# Patient Record
Sex: Female | Born: 1995 | Race: Black or African American | Hispanic: No | Marital: Single | Smoking: Never smoker
Health system: Southern US, Community
[De-identification: ages and names within clinical notes are randomized; demographics above are authoritative.]

## PROBLEM LIST (undated history)

## (undated) ENCOUNTER — Inpatient Hospital Stay (HOSPITAL_COMMUNITY): Payer: Self-pay

## (undated) DIAGNOSIS — B009 Herpesviral infection, unspecified: Secondary | ICD-10-CM

## (undated) DIAGNOSIS — D573 Sickle-cell trait: Secondary | ICD-10-CM

## (undated) DIAGNOSIS — G43909 Migraine, unspecified, not intractable, without status migrainosus: Secondary | ICD-10-CM

## (undated) DIAGNOSIS — J45909 Unspecified asthma, uncomplicated: Secondary | ICD-10-CM

## (undated) DIAGNOSIS — F410 Panic disorder [episodic paroxysmal anxiety] without agoraphobia: Secondary | ICD-10-CM

## (undated) HISTORY — DX: Herpesviral infection, unspecified: B00.9

## (undated) HISTORY — PX: TONSILLECTOMY: SUR1361

## (undated) HISTORY — PX: WISDOM TOOTH EXTRACTION: SHX21

---
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
## 2013-10-10 ENCOUNTER — Encounter: Payer: Self-pay | Admitting: *Deleted

## 2013-10-16 ENCOUNTER — Ambulatory Visit: Payer: Medicaid Other | Admitting: Neurology

## 2013-10-24 ENCOUNTER — Encounter: Payer: Self-pay | Admitting: *Deleted

## 2013-10-30 ENCOUNTER — Ambulatory Visit (INDEPENDENT_AMBULATORY_CARE_PROVIDER_SITE_OTHER): Payer: Medicaid Other | Admitting: Neurology

## 2013-10-30 ENCOUNTER — Encounter: Payer: Self-pay | Admitting: Neurology

## 2013-10-30 VITALS — BP 120/82 | HR 84 | Ht 61.0 in | Wt 113.0 lb

## 2013-10-30 DIAGNOSIS — G43709 Chronic migraine without aura, not intractable, without status migrainosus: Secondary | ICD-10-CM

## 2013-10-30 DIAGNOSIS — G4440 Drug-induced headache, not elsewhere classified, not intractable: Secondary | ICD-10-CM

## 2013-10-30 MED ORDER — TOPIRAMATE 50 MG PO TABS: ORAL_TABLET | ORAL | Status: DC
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
## 2013-10-30 NOTE — Patient Instructions (Signed)
Migraine Recommendations: 1.  When you get a headache, try taking Excedrin or naproxen (Aleve) at immediate onset of migraine. 2.  Some of the problem is that you are taking too much ibuprofen.  Limit use of pain relievers to no more than 2 days out of the week.  This will help reduce risk of rebound headaches. 3.  Keep a headache diary. 4.  Stay adequately hydrated. 5.  Maintain good sleep hygiene. 6.  Maintain proper stress management. 7.  Improve diet and start routine exercise.  Do not skip meals and cut out junk food 8.  Eliminate caffeine 9.  We will start topiramate (topamax) 50mg  tablets.  Take 1/2 tablet daily at bedtime for 7 days, then increase to 1 full tablet at bedtime.  Possible side effects include: impaired thinking, sedation, paresthesias (numbness and tingling) and weight loss.  It may cause dehydration and there is a small risk for kidney stones, so make sure to stay hydrated with water during the day.  There is also a very small risk for glaucoma, so if you notice any change in your vision while taking this medication, see an ophthalmologist.  There is also a very small risk of possible suicidal ideation, as it the case with all antiepileptic medications.  10.  Call in 4 weeks with update and we can adjust dose of topamax if needed. 11.  Follow up in 3 months.

## 2014-01-30 ENCOUNTER — Ambulatory Visit (INDEPENDENT_AMBULATORY_CARE_PROVIDER_SITE_OTHER): Payer: Medicaid Other | Admitting: Neurology

## 2014-01-30 ENCOUNTER — Encounter: Payer: Self-pay | Admitting: Neurology

## 2014-01-30 VITALS — BP 118/66 | HR 72 | Temp 97.5°F | Resp 16 | Wt 114.9 lb

## 2014-01-30 DIAGNOSIS — G4440 Drug-induced headache, not elsewhere classified, not intractable: Secondary | ICD-10-CM

## 2014-01-30 DIAGNOSIS — G43709 Chronic migraine without aura, not intractable, without status migrainosus: Secondary | ICD-10-CM | POA: Insufficient documentation

## 2014-01-30 MED ORDER — TOPIRAMATE 25 MG PO TABS: ORAL_TABLET | ORAL | Status: DC
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
## 2014-01-30 NOTE — Patient Instructions (Signed)
1.  You should be taking the topamax every day.  Start topamax  at bedtime for 7 days, then increase to 2 tablets ( ) at bedtime.  Call in 3-4 weeks with update and we can increase dose if needed. 2.  When you first get the headache, take naproxen  at earliest onset.  You may repeat dose in 12 hours if needed.  Do not take any pain relievers more than 2 days out of the week. 3.  Exercise regularly 4.  Keep a headache diary, recording which days you have a headache, what medication you took and how long the headache lasted. Then you can look back at the end of the month to see if we are making progress. 5.  Follow up in 3 months.

## 2014-05-02 ENCOUNTER — Ambulatory Visit (INDEPENDENT_AMBULATORY_CARE_PROVIDER_SITE_OTHER): Payer: Medicaid Other | Admitting: Neurology

## 2014-05-02 ENCOUNTER — Encounter: Payer: Self-pay | Admitting: Neurology

## 2014-05-02 VITALS — BP 104/70 | HR 68 | Temp 98.5°F | Resp 16 | Ht 61.0 in | Wt 117.3 lb

## 2014-05-02 DIAGNOSIS — G43009 Migraine without aura, not intractable, without status migrainosus: Secondary | ICD-10-CM

## 2014-05-02 MED ORDER — TOPIRAMATE 50 MG PO TABS: 50.0000 mg | ORAL_TABLET | Freq: Every day | ORAL | Status: DC
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
## 2014-05-02 NOTE — Patient Instructions (Signed)
I refilled the topamax with 50mg  tablets.  Take 1 tablet at bedtime daily. Take folic acid 1mg  daily Follow up in 3 months

## 2014-05-07 ENCOUNTER — Ambulatory Visit: Payer: Medicaid Other

## 2014-06-09 ENCOUNTER — Ambulatory Visit: Payer: Medicaid Other | Attending: Internal Medicine

## 2014-06-23 ENCOUNTER — Emergency Department (HOSPITAL_COMMUNITY): Payer: Medicaid Other

## 2014-06-23 ENCOUNTER — Emergency Department (HOSPITAL_COMMUNITY)
Admission: EM | Admit: 2014-06-23 | Discharge: 2014-06-23 | Disposition: A | Discharge status: Home / Self Care | Payer: Medicaid Other | Attending: Emergency Medicine | Admitting: Emergency Medicine

## 2014-06-23 ENCOUNTER — Encounter (HOSPITAL_COMMUNITY): Payer: Self-pay | Admitting: Emergency Medicine

## 2014-06-23 DIAGNOSIS — J069 Acute upper respiratory infection, unspecified: Secondary | ICD-10-CM | POA: Insufficient documentation

## 2014-06-23 DIAGNOSIS — Z79899 Other long term (current) drug therapy: Secondary | ICD-10-CM | POA: Insufficient documentation

## 2014-06-23 DIAGNOSIS — R05 Cough: Secondary | ICD-10-CM

## 2014-06-23 DIAGNOSIS — Z3202 Encounter for pregnancy test, result negative: Secondary | ICD-10-CM | POA: Insufficient documentation

## 2014-06-23 DIAGNOSIS — G43009 Migraine without aura, not intractable, without status migrainosus: Secondary | ICD-10-CM

## 2014-06-23 DIAGNOSIS — R059 Cough, unspecified: Secondary | ICD-10-CM

## 2014-06-23 DIAGNOSIS — J45909 Unspecified asthma, uncomplicated: Secondary | ICD-10-CM | POA: Insufficient documentation

## 2014-06-23 LAB — BASIC METABOLIC PANEL
ANION GAP: 7 (ref 5–15)
BUN: 12 mg/dL (ref 6–23)
CALCIUM: 9.1 mg/dL (ref 8.4–10.5)
CO2: 22 mmol/L (ref 19–32)
Chloride: 106 mmol/L (ref 96–112)
Creatinine, Ser: 0.73 mg/dL (ref 0.50–1.10)
Glucose, Bld: 89 mg/dL (ref 70–99)
Potassium: 4 mmol/L (ref 3.5–5.1)
SODIUM: 135 mmol/L (ref 135–145)

## 2014-06-23 LAB — CBC WITH DIFFERENTIAL/PLATELET
BASOS ABS: 0 10*3/uL (ref 0.0–0.1)
BASOS PCT: 0 % (ref 0–1)
EOS ABS: 0.2 10*3/uL (ref 0.0–0.7)
Eosinophils Relative: 4 % (ref 0–5)
HCT: 40.4 % (ref 36.0–46.0)
HEMOGLOBIN: 14.5 g/dL (ref 12.0–15.0)
Lymphocytes Relative: 35 % (ref 12–46)
Lymphs Abs: 2.2 10*3/uL (ref 0.7–4.0)
MCH: 28.2 pg (ref 26.0–34.0)
MCHC: 35.9 g/dL (ref 30.0–36.0)
MCV: 78.6 fL (ref 78.0–100.0)
MONO ABS: 0.5 10*3/uL (ref 0.1–1.0)
MONOS PCT: 9 % (ref 3–12)
NEUTROS ABS: 3.3 10*3/uL (ref 1.7–7.7)
NEUTROS PCT: 52 % (ref 43–77)
PLATELETS: 196 10*3/uL (ref 150–400)
RBC: 5.14 MIL/uL — AB (ref 3.87–5.11)
RDW: 12.6 % (ref 11.5–15.5)
WBC: 6.2 10*3/uL (ref 4.0–10.5)

## 2014-06-23 LAB — POC URINE PREG, ED: Preg Test, Ur: NEGATIVE

## 2014-06-23 MED ORDER — METOCLOPRAMIDE HCL 5 MG/ML IJ SOLN
10.0000 mg | Freq: Once | INTRAMUSCULAR | Status: AC
  Administered 2014-06-23: 10 mg via INTRAVENOUS
  Filled 2014-06-23: qty 2

## 2014-06-23 MED ORDER — DIPHENHYDRAMINE HCL 50 MG/ML IJ SOLN
25.0000 mg | Freq: Once | INTRAMUSCULAR | Status: AC
  Administered 2014-06-23: 25 mg via INTRAVENOUS
  Filled 2014-06-23: qty 1

## 2014-06-23 MED ORDER — SODIUM CHLORIDE 0.9 % IV BOLUS (SEPSIS)
1000.0000 mL | Freq: Once | INTRAVENOUS | Status: AC
  Administered 2014-06-23: 1000 mL via INTRAVENOUS

## 2014-06-23 MED ORDER — DEXAMETHASONE SODIUM PHOSPHATE 10 MG/ML IJ SOLN
10.0000 mg | Freq: Once | INTRAMUSCULAR | Status: AC
  Administered 2014-06-23: 10 mg via INTRAVENOUS
  Filled 2014-06-23: qty 1

## 2014-06-23 NOTE — Discharge Instructions (Signed)
Chest Pain (Nonspecific) °It is often hard to give a specific diagnosis for the cause of chest pain. There is always a chance that your pain could be related to something serious, such as a heart attack or a blood clot in the lungs. You need to follow up with your health care provider for further evaluation. °CAUSES  °· Heartburn. °· Pneumonia or bronchitis. °· Anxiety or stress. °· Inflammation around your heart (pericarditis) or lung (pleuritis or pleurisy). °· A blood clot in the lung. °· A collapsed lung (pneumothorax). It can develop suddenly on its own (spontaneous pneumothorax) or from trauma to the chest. °· Shingles infection (herpes zoster virus). °The chest wall is composed of bones, muscles, and cartilage. Any of these can be the source of the pain. °· The bones can be bruised by injury. °· The muscles or cartilage can be strained by coughing or overwork. °· The cartilage can be affected by inflammation and become sore (costochondritis). °DIAGNOSIS  °Lab tests or other studies may be needed to find the cause of your pain. Your health care provider may have you take a test called an ambulatory electrocardiogram (ECG). An ECG records your heartbeat patterns over a 24-hour period. You may also have other tests, such as: °· Transthoracic echocardiogram (TTE). During echocardiography, sound waves are used to evaluate how blood flows through your heart. °· Transesophageal echocardiogram (TEE). °· Cardiac monitoring. This allows your health care provider to monitor your heart rate and rhythm in real time. °· Holter monitor. This is a portable device that records your heartbeat and can help diagnose heart arrhythmias. It allows your health care provider to track your heart activity for several days, if needed. °· Stress tests by exercise or by giving medicine that makes the heart beat faster. °TREATMENT  °· Treatment depends on what may be causing your chest pain. Treatment may include: °· Acid blockers for  heartburn. °· Anti-inflammatory medicine. °· Pain medicine for inflammatory conditions. °· Antibiotics if an infection is present. °· You may be advised to change lifestyle habits. This includes stopping smoking and avoiding alcohol, caffeine, and chocolate. °· You may be advised to keep your head raised (elevated) when sleeping. This reduces the chance of acid going backward from your stomach into your esophagus. °Most of the time, nonspecific chest pain will improve within 2-3 days with rest and mild pain medicine.  °HOME CARE INSTRUCTIONS  °· If antibiotics were prescribed, take them as directed. Finish them even if you start to feel better. °· For the next few days, avoid physical activities that bring on chest pain. Continue physical activities as directed. °· Do not use any tobacco products, including cigarettes, chewing tobacco, or electronic cigarettes. °· Avoid drinking alcohol. °· Only take medicine as directed by your health care provider. °· Follow your health care provider's suggestions for further testing if your chest pain does not go away. °· Keep any follow-up appointments you made. If you do not go to an appointment, you could develop lasting (chronic) problems with pain. If there is any problem keeping an appointment, call to reschedule. °SEEK MEDICAL CARE IF:  °· Your chest pain does not go away, even after treatment. °· You have a rash with blisters on your chest. °· You have a fever. °SEEK IMMEDIATE MEDICAL CARE IF:  °· You have increased chest pain or pain that spreads to your arm, neck, jaw, back, or abdomen. °· You have shortness of breath. °· You have an increasing cough, or you cough   up blood. °· You have severe back or abdominal pain. °· You feel nauseous or vomit. °· You have severe weakness. °· You faint. °· You have chills. °This is an emergency. Do not wait to see if the pain will go away. Get medical help at once. Call your local emergency services (911 in [place] Do not drive  yourself to the hospital. °MAKE SURE YOU:  °· Understand these instructions. °· Will watch your condition. °· Will get help right away if you are not doing well or get worse. °Document Released: 02/09/2005 Document Revised: 05/07/2013 Document Reviewed: 12/06/2007 °[employer]® Patient Information ©2015 [employer]. This information is not intended to replace advice given to you by your health care provider. Make sure you discuss any questions you have with your health care provider. ° °Migraine Headache °A migraine headache is an intense, throbbing pain on one or both sides of your head. A migraine can last for 30 minutes to several hours. °CAUSES  °The exact cause of a migraine headache is not always known. However, a migraine may be caused when nerves in the brain become irritated and release chemicals that cause inflammation. This causes pain. °Certain things may also trigger migraines, such as: °· Alcohol. °· Smoking. °· Stress. °· Menstruation. °· Aged cheeses. °· Foods or drinks that contain nitrates, glutamate, aspartame, or tyramine. °· Lack of sleep. °· Chocolate. °· Caffeine. °· Hunger. °· Physical exertion. °· Fatigue. °· Medicines used to treat chest pain (nitroglycerine), birth control pills, estrogen, and some blood pressure medicines. °SIGNS AND SYMPTOMS °· Pain on one or both sides of your head. °· Pulsating or throbbing pain. °· Severe pain that prevents daily activities. °· Pain that is aggravated by any physical activity. °· Nausea, vomiting, or both. °· Dizziness. °· Pain with exposure to bright lights, loud noises, or activity. °· General sensitivity to bright lights, loud noises, or smells. °Before you get a migraine, you may get warning signs that a migraine is coming (aura). An aura may include: °· Seeing flashing lights. °· Seeing bright spots, halos, or zigzag lines. °· Having tunnel vision or blurred vision. °· Having feelings of numbness or tingling. °· Having trouble talking. °· Having  muscle weakness. °DIAGNOSIS  °A migraine headache is often diagnosed based on: °· Symptoms. °· Physical exam. °· A CT scan or MRI of your head. These imaging tests cannot diagnose migraines, but they can help rule out other causes of headaches. °TREATMENT °Medicines may be given for pain and nausea. Medicines can also be given to help prevent recurrent migraines.  °HOME CARE INSTRUCTIONS °· Only take over-the-counter or prescription medicines for pain or discomfort as directed by your health care provider. The use of long-term narcotics is not recommended. °· Lie down in a dark, quiet room when you have a migraine. °· Keep a journal to find out what may trigger your migraine headaches. For example, write down: °¨ What you eat and drink. °¨ How much sleep you get. °¨ Any change to your diet or medicines. °· Limit alcohol consumption. °· Quit smoking if you smoke. °· Get 7-9 hours of sleep, or as recommended by your health care provider. °· Limit stress. °· Keep lights dim if bright lights bother you and make your migraines worse. °SEEK IMMEDIATE MEDICAL CARE IF:  °· Your migraine becomes severe. °· You have a fever. °· You have a stiff neck. °· You have vision loss. °· You have muscular weakness or loss of muscle control. °· You start losing   your balance or have trouble walking. °· You feel faint or pass out. °· You have severe symptoms that are different from your first symptoms. °MAKE SURE YOU:  °· Understand these instructions. °· Will watch your condition. °· Will get help right away if you are not doing well or get worse. °Document Released: 05/02/2005 Document Revised: 09/16/2013 Document Reviewed: 01/07/2013 °[employer]® Patient Information ©2015 [employer]. This information is not intended to replace advice given to you by your health care provider. Make sure you discuss any questions you have with your health care provider. ° °

## 2014-06-23 NOTE — ED Notes (Signed)
Pt c/o cough and pain with cough and HA x 2 days

## 2014-06-23 NOTE — ED Notes (Signed)
Attempted IV start X1, unable to thread catheter through, will have second RN to attempt.

## 2014-06-23 NOTE — ED Provider Notes (Signed)
CSN: [id number]     Arrival date & time 06/23/14  1227 History   First MD Initiated Contact with Patient 06/23/14 1622     Chief Complaint  Patient presents with  . Cough  . Headache     (Consider location/radiation/quality/duration/timing/severity/associated sxs/prior Treatment) Patient is a 19 y.o. female presenting with cough and headaches.  Cough Cough characteristics:  Non-productive Severity:  Moderate Onset quality:  Gradual Duration:  3 days Timing:  Constant Progression:  Unchanged Chronicity:  New Smoker: no   Context: not upper respiratory infection   Relieved by:  Nothing Worsened by:  Nothing tried Associated symptoms: chest pain (pressure, worse with coughing and breathing) and headaches   Associated symptoms: no diaphoresis, no rhinorrhea, no shortness of breath and no sinus congestion   Headache Associated symptoms: cough     Past Medical History  Diagnosis Date  . Migraines   . Asthma    History reviewed. No pertinent past surgical history. Family History  Problem Relation Age of Onset  . Bone cancer Mother    History  Substance Use Topics  . Smoking status: Never Smoker   . Smokeless tobacco: Not on file  . Alcohol Use: 0.0 oz/week    0 Not specified per week     Comment: social   OB History    No data available     Review of Systems  Constitutional: Negative for diaphoresis.  HENT: Negative for rhinorrhea.   Respiratory: Positive for cough. Negative for shortness of breath.   Cardiovascular: Positive for chest pain (pressure, worse with coughing and breathing).  Neurological: Positive for headaches.  All other systems reviewed and are negative.     Allergies  Review of patient's allergies indicates no known allergies.  Home Medications   Prior to Admission medications   Medication Sig Start Date End Date Taking? Authorizing Provider  albuterol (PROVENTIL HFA;VENTOLIN HFA) 108 (90 BASE) MCG/ACT inhaler Inhale 2 puffs into the  lungs every 6 (six) hours as needed. For shortness of breath    Historical Provider, MD  ibuprofen (ADVIL,MOTRIN) 200 MG tablet Take 200 mg by mouth every 6 (six) hours as needed.    Historical Provider, MD  topiramate (TOPAMAX) 50 MG tablet Take 1 tablet (50 mg total) by mouth at bedtime. 05/02/14   Adam Gus Rankinobert Jaffe, DO   BP 122/60 mmHg  Pulse 78  Temp(Src) 97.9 F (36.6 C) (Oral)  Resp 18  SpO2 100% Physical Exam  Constitutional: She is oriented to person, place, and time. She appears well-developed and well-nourished.  HENT:  Head: Normocephalic and atraumatic.  Right Ear: External ear normal.  Left Ear: External ear normal.  Eyes: Conjunctivae and EOM are normal. Pupils are equal, round, and reactive to light.  Neck: Normal range of motion. Neck supple.  Cardiovascular: Normal rate, regular rhythm, normal heart sounds and intact distal pulses.   Pulmonary/Chest: Effort normal and breath sounds normal.  Abdominal: Soft. Bowel sounds are normal. There is no tenderness.  Musculoskeletal: Normal range of motion.  Neurological: She is alert and oriented to person, place, and time.  Skin: Skin is warm and dry.  Vitals reviewed.   ED Course  Procedures (including critical care time) Labs Review Labs Reviewed  CBC WITH DIFFERENTIAL/PLATELET - Abnormal; Notable for the following:    RBC 5.14 (*)    All other components within normal limits  BASIC METABOLIC PANEL  POC URINE PREG, ED    Imaging Review Dg Chest 2 View (if Patient Has Fever  And/or Copd)  06/23/2014   CLINICAL DATA:  Chest pain since 06/20/2014.  No known injury.  EXAM: CHEST  2 VIEW  COMPARISON:  PA and lateral chest 01/27/2012.  FINDINGS: Heart size and mediastinal contours are within normal limits. Both lungs are clear. Visualized skeletal structures are unremarkable.  IMPRESSION: Negative exam.   Electronically Signed   By: Drusilla Kanner M.D.   On: 06/23/2014 13:35     EKG Interpretation   Date/Time:   Monday June 23 2014 12:38:24 EST Ventricular Rate:  81 PR Interval:  152 QRS Duration: 80 QT Interval:  376 QTC Calculation: 436 R Axis:   88 Text Interpretation:  Normal sinus rhythm with sinus arrhythmia Normal ECG  No significant change since last tracing Confirmed by Mirian Mo  [phone]) on 06/23/2014 5:57:08 PM      MDM   Final diagnoses:  Migraine without aura and without status migrainosus, not intractable  Cough  Upper respiratory infection    19 y.o. female with pertinent PMH of migraines, asthma presents with headache and chest pain, first symptoms 1 week ago.  On arrival she is well appearing, stable vitals.  Benign exam.  No dysuria.  Wu as above with clear CXR.  ECG unremarkable.  Ha relieved with reglan, benadryl, decadron.  Pt requests to leave.  DC home in stable condition  I have reviewed all laboratory and imaging studies if ordered as above  1. Migraine without aura and without status migrainosus, not intractable   2. Cough   3. Upper respiratory infection         Mirian Mo, MD 06/23/14 [phone]

## 2014-08-01 ENCOUNTER — Ambulatory Visit: Payer: Medicaid Other | Admitting: Neurology

## 2014-11-21 ENCOUNTER — Encounter (HOSPITAL_COMMUNITY): Payer: Self-pay

## 2014-11-21 ENCOUNTER — Emergency Department (HOSPITAL_COMMUNITY)
Admission: EM | Admit: 2014-11-21 | Discharge: 2014-11-21 | Disposition: A | Discharge status: Home / Self Care | Payer: Self-pay | Attending: Emergency Medicine | Admitting: Emergency Medicine

## 2014-11-21 DIAGNOSIS — Z3202 Encounter for pregnancy test, result negative: Secondary | ICD-10-CM | POA: Insufficient documentation

## 2014-11-21 DIAGNOSIS — Z7951 Long term (current) use of inhaled steroids: Secondary | ICD-10-CM | POA: Insufficient documentation

## 2014-11-21 DIAGNOSIS — N939 Abnormal uterine and vaginal bleeding, unspecified: Secondary | ICD-10-CM | POA: Insufficient documentation

## 2014-11-21 DIAGNOSIS — G43909 Migraine, unspecified, not intractable, without status migrainosus: Secondary | ICD-10-CM | POA: Insufficient documentation

## 2014-11-21 DIAGNOSIS — J45909 Unspecified asthma, uncomplicated: Secondary | ICD-10-CM | POA: Insufficient documentation

## 2014-11-21 LAB — I-STAT CHEM 8, ED
BUN: 9 mg/dL (ref 6–20)
CALCIUM ION: 1.23 mmol/L (ref 1.12–1.23)
CHLORIDE: 103 mmol/L (ref 101–111)
CREATININE: 0.8 mg/dL (ref 0.44–1.00)
Glucose, Bld: 89 mg/dL (ref 65–99)
HCT: 39 % (ref 36.0–46.0)
Hemoglobin: 13.3 g/dL (ref 12.0–15.0)
Potassium: 3.9 mmol/L (ref 3.5–5.1)
Sodium: 138 mmol/L (ref 135–145)
TCO2: 24 mmol/L (ref 0–100)

## 2014-11-21 LAB — WET PREP, GENITAL
CLUE CELLS WET PREP: NONE SEEN
TRICH WET PREP: NONE SEEN
YEAST WET PREP: NONE SEEN

## 2014-11-21 LAB — HIV ANTIBODY (ROUTINE TESTING W REFLEX): HIV SCREEN 4TH GENERATION: NONREACTIVE

## 2014-11-21 LAB — POC URINE PREG, ED: Preg Test, Ur: NEGATIVE

## 2014-11-21 NOTE — ED Notes (Signed)
Pt reports she stopped having periods from April 2015 (over 1 year ago) and then started her period again on 6/28. Pt states the bleeding has not stopped since 6/28. Denies any dizziness/lightheadedness at this time but states she sometimes experienced these.

## 2014-11-21 NOTE — Discharge Instructions (Signed)
Abnormal Uterine Bleeding Abnormal uterine bleeding can affect women at various stages in life, including teenagers, women in their reproductive years, pregnant women, and women who have reached menopause. Several kinds of uterine bleeding are considered abnormal, including:  Bleeding or spotting between periods.   Bleeding after sexual intercourse.   Bleeding that is heavier or more than normal.   Periods that last longer than usual.  Bleeding after menopause.  Many cases of abnormal uterine bleeding are minor and simple to treat, while others are more serious. Any type of abnormal bleeding should be evaluated by your health care provider. Treatment will depend on the cause of the bleeding. HOME CARE INSTRUCTIONS Monitor your condition for any changes. The following actions may help to alleviate any discomfort you are experiencing:  Avoid the use of tampons and douches as directed by your health care provider.  Change your pads frequently. You should get regular pelvic exams and Pap tests. Keep all follow-up appointments for diagnostic tests as directed by your health care provider.  SEEK MEDICAL CARE IF:   Your bleeding lasts more than 1 week.   You feel dizzy at times.  SEEK IMMEDIATE MEDICAL CARE IF:   You pass out.   You are changing pads every 15 to 30 minutes.   You have abdominal pain.  You have a fever.   You become sweaty or weak.   You are passing large blood clots from the vagina.   You start to feel nauseous and vomit. MAKE SURE YOU:   Understand these instructions.  Will watch your condition.  Will get help right away if you are not doing well or get worse. Document Released: 05/02/2005 Document Revised: 05/07/2013 Document Reviewed: 11/29/2012 ExitCare Patient Information 2015 [employer]. This information is not intended to replace advice given to you by your health care provider. Make sure you discuss any questions you have with your  health care provider.  

## 2014-11-21 NOTE — ED Provider Notes (Signed)
CSN: [id number]     Arrival date & time 11/21/14  4098 History   First MD Initiated Contact with Patient 11/21/14 [phone]     Chief Complaint  Patient presents with  . Vaginal Bleeding     (Consider location/radiation/quality/duration/timing/severity/associated sxs/prior Treatment) HPI Kathleen Spears is a 19 y.o. female who comes in for evaluation of vaginal bleeding. Patient states she has been off and on of birth control, but discontinued use of birth control in October 2015 and has not had a period since. She reports starting her cycle began on June 28 and has been constantly bleeding since then using multiple pads and tampons throughout the day. She denies unusual headache, chest pain, shortness of breath, numbness or weakness. She does report associated nausea and feels like "my taste buds are dead". She denies any abdominal pain or urinary symptoms at this time. No fevers or chills.  Past Medical History  Diagnosis Date  . Migraines   . Asthma    History reviewed. No pertinent past surgical history. Family History  Problem Relation Age of Onset  . Bone cancer Mother    History  Substance Use Topics  . Smoking status: Never Smoker   . Smokeless tobacco: Not on file  . Alcohol Use: 0.0 oz/week    0 Standard drinks or equivalent per week     Comment: social   OB History    No data available     Review of Systems A 10 point review of systems was completed and was negative except for pertinent positives and negatives as mentioned in the history of present illness     Allergies  Review of patient's allergies indicates no known allergies.  Home Medications   Prior to Admission medications   Medication Sig Start Date End Date Taking? Authorizing Provider  albuterol (PROVENTIL HFA;VENTOLIN HFA) 108 (90 BASE) MCG/ACT inhaler Inhale 2 puffs into the lungs every 6 (six) hours as needed. For shortness of breath    Historical Provider, MD  ibuprofen (ADVIL,MOTRIN) 200 MG tablet  Take 200 mg by mouth every 6 (six) hours as needed.    Historical Provider, MD  topiramate (TOPAMAX) 50 MG tablet Take 1 tablet (50 mg total) by mouth at bedtime. 05/02/14   Drema Dallas, DO   BP 116/66 mmHg  Pulse 86  Temp(Src) 98.8 F (37.1 C) (Oral)  Resp 12  SpO2 100%  LMP 11/11/2014 Physical Exam  Constitutional: She is oriented to person, place, and time. She appears well-developed and well-nourished.  HENT:  Head: Normocephalic and atraumatic.  Mouth/Throat: Oropharynx is clear and moist.  Eyes: Conjunctivae are normal. Pupils are equal, round, and reactive to light. Right eye exhibits no discharge. Left eye exhibits no discharge. No scleral icterus.  Neck: Neck supple.  Cardiovascular: Normal rate, regular rhythm and normal heart sounds.   Pulmonary/Chest: Effort normal and breath sounds normal. No respiratory distress. She has no wheezes. She has no rales.  Abdominal: Soft. There is no tenderness.  Genitourinary:  Chaperone was present for the entire genital exam. No lesions or rashes appreciated on vulva. Cervix visualized on speculum exam and appropriate cultures sampled. Scant blood in vaginal vault. Discharge: none Upon bi manual exam- No TTP of the adnexa, no cervical motion tenderness. No fullness or masses appreciated. No abnormalities appreciated in structural anatomy.   Musculoskeletal: She exhibits no tenderness.  Neurological: She is alert and oriented to person, place, and time.  Cranial Nerves II-XII grossly intact  Skin: Skin is warm  and dry. No rash noted.  Psychiatric: She has a normal mood and affect.  Nursing note and vitals reviewed.   ED Course  Procedures (including critical care time) Labs Review Labs Reviewed  WET PREP, GENITAL - Abnormal; Notable for the following:    WBC, Wet Prep HPF POC FEW (*)    All other components within normal limits  HIV ANTIBODY (ROUTINE TESTING)  POC URINE PREG, ED  I-STAT CHEM 8, ED  GC/CHLAMYDIA PROBE AMP  ([place] NOT [facility]    Imaging Review No results found.   EKG Interpretation None     Meds given in ED:  Medications - No data to display  New Prescriptions   No medications on file   Filed Vitals:   11/21/14 0842  BP: 116/66  Pulse: 86  Temp: 98.8 F (37.1 C)  TempSrc: Oral  Resp: 12  SpO2: 100%    MDM  Vitals stable - WNL -afebrile Pt resting comfortably in ED. PE--benign abdominal exam and pelvic exam was not concerning. Labwork--labs are baseline and noncontributory. No evidence of anemia. Per nursing negative  DDX--[facility] with [facility] for further evaluation and management of dysfunctional uterine bleeding. Patient denies any abdominal pain at this time. I have low suspicion for any other acute or emergent intra-abdominal or pelvic pathology at this time.  Patient stable, in good condition and is appropriate for discharge. I discussed all relevant lab findings and imaging results with pt and they verbalized understanding. Discussed f/u with PCP within 48 hrs and return precautions, pt very amenable to plan.  Final diagnoses:  Abnormal uterine bleeding       Joycie PeekBenjamin Theresa Wedel, PA-C 11/21/14 1029  Bethann BerkshireJoseph Zammit, MD 11/22/14 [phone]

## 2014-11-24 LAB — GC/CHLAMYDIA PROBE AMP (~~LOC~~) NOT AT ARMC
CHLAMYDIA, DNA PROBE: NEGATIVE
NEISSERIA GONORRHEA: NEGATIVE

## 2015-07-08 DIAGNOSIS — J45909 Unspecified asthma, uncomplicated: Secondary | ICD-10-CM | POA: Insufficient documentation

## 2015-12-11 LAB — OB RESULTS CONSOLE HIV ANTIBODY (ROUTINE TESTING): HIV: NONREACTIVE

## 2015-12-11 LAB — OB RESULTS CONSOLE RUBELLA ANTIBODY, IGM: RUBELLA: IMMUNE

## 2015-12-11 LAB — OB RESULTS CONSOLE ABO/RH: RH Type: POSITIVE

## 2015-12-11 LAB — OB RESULTS CONSOLE RPR: RPR: NONREACTIVE

## 2015-12-11 LAB — OB RESULTS CONSOLE GC/CHLAMYDIA
CHLAMYDIA, DNA PROBE: NEGATIVE
GC PROBE AMP, GENITAL: NEGATIVE

## 2015-12-11 LAB — OB RESULTS CONSOLE ANTIBODY SCREEN: ANTIBODY SCREEN: NEGATIVE

## 2015-12-11 LAB — OB RESULTS CONSOLE HEPATITIS B SURFACE ANTIGEN: Hepatitis B Surface Ag: NEGATIVE

## 2016-04-04 ENCOUNTER — Inpatient Hospital Stay (HOSPITAL_COMMUNITY)
Admission: AD | Admit: 2016-04-04 | Discharge: 2016-04-04 | Disposition: A | Discharge status: Home / Self Care | Payer: Medicaid Other | Source: Ambulatory Visit | Attending: Obstetrics and Gynecology | Admitting: Obstetrics and Gynecology

## 2016-04-04 ENCOUNTER — Encounter (HOSPITAL_COMMUNITY): Payer: Self-pay | Admitting: *Deleted

## 2016-04-04 DIAGNOSIS — O99343 Other mental disorders complicating pregnancy, third trimester: Secondary | ICD-10-CM | POA: Insufficient documentation

## 2016-04-04 DIAGNOSIS — O99513 Diseases of the respiratory system complicating pregnancy, third trimester: Secondary | ICD-10-CM | POA: Diagnosis not present

## 2016-04-04 DIAGNOSIS — Z3A29 29 weeks gestation of pregnancy: Secondary | ICD-10-CM | POA: Insufficient documentation

## 2016-04-04 DIAGNOSIS — J45909 Unspecified asthma, uncomplicated: Secondary | ICD-10-CM | POA: Insufficient documentation

## 2016-04-04 DIAGNOSIS — O4703 False labor before 37 completed weeks of gestation, third trimester: Secondary | ICD-10-CM

## 2016-04-04 DIAGNOSIS — R109 Unspecified abdominal pain: Secondary | ICD-10-CM | POA: Insufficient documentation

## 2016-04-04 DIAGNOSIS — D573 Sickle-cell trait: Secondary | ICD-10-CM | POA: Diagnosis not present

## 2016-04-04 DIAGNOSIS — F419 Anxiety disorder, unspecified: Secondary | ICD-10-CM | POA: Diagnosis not present

## 2016-04-04 DIAGNOSIS — O26893 Other specified pregnancy related conditions, third trimester: Secondary | ICD-10-CM | POA: Insufficient documentation

## 2016-04-04 DIAGNOSIS — R51 Headache: Secondary | ICD-10-CM | POA: Insufficient documentation

## 2016-04-04 HISTORY — DX: Panic disorder (episodic paroxysmal anxiety): F41.0

## 2016-04-04 HISTORY — DX: Sickle-cell trait: D57.3

## 2016-04-04 LAB — COMPREHENSIVE METABOLIC PANEL
ALT: 13 U/L — ABNORMAL LOW (ref 14–54)
AST: 20 U/L (ref 15–41)
Albumin: 3.4 g/dL — ABNORMAL LOW (ref 3.5–5.0)
Alkaline Phosphatase: 106 U/L (ref 38–126)
Anion gap: 7 (ref 5–15)
BUN: <5 mg/dL — ABNORMAL LOW (ref 6–20)
CO2: 23 mmol/L (ref 22–32)
Calcium: 9.5 mg/dL (ref 8.9–10.3)
Chloride: 105 mmol/L (ref 101–111)
Creatinine, Ser: 0.65 mg/dL (ref 0.44–1.00)
GFR calc Af Amer: >60 mL/min (ref >60)
GFR calc non Af Amer: >60 mL/min (ref >60)
Glucose, Bld: 77 mg/dL (ref 65–99)
Potassium: 3.9 mmol/L (ref 3.5–5.1)
Sodium: 135 mmol/L (ref 135–145)
Total Bilirubin: 1 mg/dL (ref 0.3–1.2)
Total Protein: 7 g/dL (ref 6.5–8.1)

## 2016-04-04 LAB — WET PREP, GENITAL
Clue Cells Wet Prep HPF POC: NONE SEEN
Sperm: NONE SEEN
Trich, Wet Prep: NONE SEEN
Yeast Wet Prep HPF POC: NONE SEEN

## 2016-04-04 LAB — URINALYSIS, ROUTINE W REFLEX MICROSCOPIC
BILIRUBIN URINE: NEGATIVE
Glucose, UA: NEGATIVE mg/dL
Hgb urine dipstick: NEGATIVE
KETONES UR: NEGATIVE mg/dL
NITRITE: NEGATIVE
Protein, ur: 100 mg/dL — AB
SPECIFIC GRAVITY, URINE: 1.025 (ref 1.005–1.030)
pH: 6.5 (ref 5.0–8.0)

## 2016-04-04 LAB — FETAL FIBRONECTIN: Fetal Fibronectin: POSITIVE — AB

## 2016-04-04 LAB — CBC
HCT: 35 % — ABNORMAL LOW (ref 36.0–46.0)
Hemoglobin: 12.4 g/dL (ref 12.0–15.0)
MCH: 27.7 pg (ref 26.0–34.0)
MCHC: 35.4 g/dL (ref 30.0–36.0)
MCV: 78.1 fL (ref 78.0–100.0)
Platelets: 201 10*3/uL (ref 150–400)
RBC: 4.48 MIL/uL (ref 3.87–5.11)
RDW: 13.8 % (ref 11.5–15.5)
WBC: 9.4 10*3/uL (ref 4.0–10.5)

## 2016-04-04 LAB — URINE MICROSCOPIC-ADD ON: RBC / HPF: NONE SEEN RBC/hpf (ref 0–5)

## 2016-04-04 MED ORDER — LACTATED RINGERS IV BOLUS (SEPSIS)
1000.0000 mL | Freq: Once | INTRAVENOUS | Status: AC
  Administered 2016-04-04: 1000 mL via INTRAVENOUS

## 2016-04-04 MED ORDER — NIFEDIPINE 10 MG PO CAPS: 10.0000 mg | ORAL_CAPSULE | Freq: Three times a day (TID) | ORAL | 0 refills | Status: DC

## 2016-04-04 MED ORDER — TERBUTALINE SULFATE 1 MG/ML IJ SOLN
0.2500 mg | Freq: Once | INTRAMUSCULAR | Status: AC
  Administered 2016-04-04: 0.25 mg via SUBCUTANEOUS
  Filled 2016-04-04: qty 1

## 2016-04-04 MED ORDER — BETAMETHASONE SOD PHOS & ACET 6 (3-3) MG/ML IJ SUSP
12.0000 mg | Freq: Once | INTRAMUSCULAR | Status: AC
  Administered 2016-04-04: 12 mg via INTRAMUSCULAR
  Filled 2016-04-04: qty 2

## 2016-04-04 MED ORDER — NIFEDIPINE 10 MG PO CAPS
10.0000 mg | ORAL_CAPSULE | Freq: Once | ORAL | Status: AC
  Administered 2016-04-04: 10 mg via ORAL
  Filled 2016-04-04: qty 1

## 2016-04-04 NOTE — Discharge Instructions (Signed)
Preterm Labor and Birth Information °Pregnancy normally lasts 39-41 weeks. Preterm labor is when labor starts early. It starts before you have been pregnant for 37 whole weeks. °What are the risk factors for preterm labor? °Preterm labor is more likely to occur in women who: °· Have an infection while pregnant. °· Have a cervix that is short. °· Have gone into preterm labor before. °· Have had surgery on their cervix. °· Are younger than age 22. °· Are older than age 35. °· Are African American. °· Are pregnant with two or more babies. °· Take street drugs while pregnant. °· Smoke while pregnant. °· Do not gain enough weight while pregnant. °· Got pregnant right after another pregnancy. °What are the symptoms of preterm labor? °Symptoms of preterm labor include: °· Cramps. The cramps may feel like the cramps some women get during their period. The cramps may happen with watery poop (diarrhea). °· Pain in the belly (abdomen). °· Pain in the lower back. °· Regular contractions or tightening. It may feel like your belly is getting tighter. °· Pressure in the lower belly that seems to get stronger. °· More fluid (discharge) leaking from the vagina. The fluid may be watery or bloody. °· Water breaking. °Why is it important to notice signs of preterm labor? °Babies who are born early may not be fully developed. They have a higher chance for: °· Long-term heart problems. °· Long-term lung problems. °· Trouble controlling body systems, like breathing. °· Bleeding in the brain. °· A condition called cerebral palsy. °· Learning difficulties. °· Death. °These risks are highest for babies who are born before 34 weeks of pregnancy. °How is preterm labor treated? °Treatment depends on: °· How long you were pregnant. °· Your condition. °· The health of your baby. °Treatment may involve: °· Having a stitch (suture) placed in your cervix. When you give birth, your cervix opens so the baby can come out. The stitch keeps the cervix  from opening too soon. °· Staying at the hospital. °· Taking or getting medicines, such as: °¨ Hormone medicines. °¨ Medicines to stop contractions. °¨ Medicines to help the baby’s lungs develop. °¨ Medicines to prevent your baby from having cerebral palsy. °What should I do if I am in preterm labor? °If you think you are going into labor too soon, call your doctor right away. °How can I prevent preterm labor? °· Do not use any tobacco products. °¨ Examples of these are cigarettes, chewing tobacco, and e-cigarettes. °¨ If you need help quitting, ask your doctor. °· Do not use street drugs. °· Do not use any medicines unless you ask your doctor if they are safe for you. °· Talk with your doctor before taking any herbal supplements. °· Make sure you gain enough weight. °· Watch for infection. If you think you might have an infection, get it checked right away. °· If you have gone into preterm labor before, tell your doctor. °This information is not intended to replace advice given to you by your health care provider. Make sure you discuss any questions you have with your health care provider. °Document Released: 07/29/2008 Document Revised: 10/13/2015 Document Reviewed: 09/23/2015 °Elsevier Interactive Patient Education © 2017 [employer] ° °

## 2016-04-04 NOTE — MAU Provider Note (Signed)
History     CSN: [id number]  Arrival date and time: 04/04/16 1022   First Provider Initiated Contact with Patient 04/04/16 1100      Chief Complaint  Patient presents with  . Loss of Consciousness  . chest congestion   HPI  Kathleen Spears is a 20 y.o. G1P0 at [redacted]w[redacted]d. She presents with c/o abd cramping/pressure since this am.  No change in discharge, odor or itching. No leaking or bleeding. Increased FM. She also has a headache with blurry vision. Hx headaches, has been taking Tylenol daily since pregnant, ha always returns. She had episode  of feeling hot and sweaty with  increased abd pressure at work this am, got better after having OJ. She had reported this episode as fainting but did not have ROC.   OB History    Gravida Para Term Preterm AB Living   1             SAB TAB Ectopic Multiple Live Births                  Past Medical History:  Diagnosis Date  . Anxiety attack   . Asthma   . Migraines   . Sickle-cell trait [facility])     Past Surgical History:  Procedure Laterality Date  . TONSILLECTOMY    . WISDOM TOOTH EXTRACTION      Family History  Problem Relation Age of Onset  . Bone cancer Mother     Social History  Substance Use Topics  . Smoking status: Never Smoker  . Smokeless tobacco: Never Used  . Alcohol use No     Comment: social    Allergies: No Known Allergies  Prescriptions Prior to Admission  Medication Sig Dispense Refill Last Dose  . Doxylamine-Pyridoxine (DICLEGIS PO) Take 1 tablet by mouth daily as needed (nausea).   04/04/2016 at Unknown time  . ibuprofen (ADVIL,MOTRIN) 200 MG tablet Take 200 mg by mouth every 6 (six) hours as needed.   Past Week at Unknown time  . Prenatal Vit-Fe Fumarate-FA (PRENATAL MULTIVITAMIN) TABS tablet Take 1 tablet by mouth daily at 12 noon.   04/04/2016 at Unknown time  . albuterol (PROVENTIL HFA;VENTOLIN HFA) 108 (90 BASE) MCG/ACT inhaler Inhale 2 puffs into the lungs every 6 (six) hours as needed. For  shortness of breath   rescue  . topiramate (TOPAMAX) 50 MG tablet Take 1 tablet (50 mg total) by mouth at bedtime. (Patient not taking: Reported on 04/04/2016) 90 tablet 0 Not Taking at Unknown time    Review of Systems  Constitutional: Positive for diaphoresis. Negative for chills and fever.  Gastrointestinal: Positive for abdominal pain.  Genitourinary: Negative for dysuria, frequency and urgency.       No bleeding, leaking or change in discharge  Neurological: Positive for weakness.   Physical Exam   Blood pressure 117/65, pulse 96, temperature 98 F (36.7 C), temperature source Oral, resp. rate 16, SpO2 100 %.  Physical Exam  Constitutional: She is oriented to person, place, and time. She appears well-developed and well-nourished.  Cardiovascular: Normal rate, regular rhythm and normal heart sounds.   Respiratory: Effort normal and breath sounds normal.  GI: Soft. She exhibits no distension. There is no tenderness. There is no rebound and no guarding.  Genitourinary:  Genitourinary Comments: Plvic exam: Ext gen- nl anatomy, skin intact Vagina- small amt mucoid whtie discharge Cx- internal os closed, 50 % effaced Uterus- gravid, non tender And- non tender  Musculoskeletal: Normal range of motion.  Neurological: She is alert and oriented to person, place, and time.  Skin: Skin is warm and dry.  Psychiatric: She has a normal mood and affect. Her behavior is normal.    MAU Course  Procedures  MDM Results for orders placed or performed during the hospital encounter of 04/04/16 (from the past 24 hour(s))  Urinalysis, Routine w reflex microscopic (not [facility])     Status: Abnormal   Collection Time: 04/04/16 10:31 AM  Result Value Ref Range   Color, Urine YELLOW YELLOW   APPearance CLOUDY (A) CLEAR   Specific Gravity, Urine 1.025 1.005 - 1.030   pH 6.5 5.0 - 8.0   Glucose, UA NEGATIVE NEGATIVE mg/dL   Hgb urine dipstick NEGATIVE NEGATIVE   Bilirubin Urine NEGATIVE  NEGATIVE   Ketones, ur NEGATIVE NEGATIVE mg/dL   Protein, ur 161100 (A) NEGATIVE mg/dL   Nitrite NEGATIVE NEGATIVE   Leukocytes, UA TRACE (A) NEGATIVE  Urine microscopic-add on     Status: Abnormal   Collection Time: 04/04/16 10:31 AM  Result Value Ref Range   Squamous Epithelial / LPF 6-30 (A) NONE SEEN   WBC, UA 6-30 0 - 5 WBC/hpf   RBC / HPF NONE SEEN 0 - 5 RBC/hpf   Bacteria, UA MANY (A) NONE SEEN  Wet prep, genital     Status: Abnormal   Collection Time: 04/04/16 12:25 PM  Result Value Ref Range   Yeast Wet Prep HPF POC NONE SEEN NONE SEEN   Trich, Wet Prep NONE SEEN NONE SEEN   Clue Cells Wet Prep HPF POC NONE SEEN NONE SEEN   WBC, Wet Prep HPF POC FEW (A) NONE SEEN   Sperm NONE SEEN   Fetal fibronectin     Status: Abnormal   Collection Time: 04/04/16 12:25 PM  Result Value Ref Range   Fetal Fibronectin POSITIVE (A) NEGATIVE  CBC     Status: Abnormal   Collection Time: 04/04/16  1:46 PM  Result Value Ref Range   WBC 9.4 4.0 - 10.5 K/uL   RBC 4.48 3.87 - 5.11 MIL/uL   Hemoglobin 12.4 12.0 - 15.0 g/dL   HCT 09.635.0 (L) 04.536.0 - 40.946.0 %   MCV 78.1 78.0 - 100.0 fL   MCH 27.7 26.0 - 34.0 pg   MCHC 35.4 30.0 - 36.0 g/dL   RDW 81.113.8 91.411.5 - 78.215.5 %   Platelets 201 150 - 400 K/uL  Comprehensive metabolic panel     Status: Abnormal   Collection Time: 04/04/16  1:46 PM  Result Value Ref Range   Sodium 135 135 - 145 mmol/L   Potassium 3.9 3.5 - 5.1 mmol/L   Chloride 105 101 - 111 mmol/L   CO2 23 22 - 32 mmol/L   Glucose, Bld 77 65 - 99 mg/dL   BUN <5 (L) 6 - 20 mg/dL   Creatinine, Ser 9.560.65 0.44 - 1.00 mg/dL   Calcium 9.5 8.9 - 21.310.3 mg/dL   Total Protein 7.0 6.5 - 8.1 g/dL   Albumin 3.4 (L) 3.5 - 5.0 g/dL   AST 20 15 - 41 U/L   ALT 13 (L) 14 - 54 U/L   Alkaline Phosphatase 106 38 - 126 U/L   Total Bilirubin 1.0 0.3 - 1.2 mg/dL   GFR calc non Af Amer >60 >60 mL/min   GFR calc Af Amer >60 >60 mL/min   Anion gap 7 5 - 15    1:35pm- + FFN, consulted with Dr Senaida Oresichardson, give  Procardia, get other labs. 4:15pm-  Consulted with Dr Senaida Oresichardson- labs are neg, pt continues to c/o abd pain, contracting or not. She has had 2 doses of Procardia, spaces contractions out. Pt ha has resolved with lights off. Will give one dose Terb sq and start steriods, re check cx. If unchanged to send home on Procardia prn.   4:55 cx unchanged.  Assessment and Plan  29 5/7 wks with abd pain and+ FFN Steroids started Cervix unchanged Procardia prn at home Note to be out of work x 2 d, RTO as scheduled 11/27 Precautions reviewed   Bo Rogue M. 04/04/2016, 12:00 PM

## 2016-04-04 NOTE — MAU Note (Signed)
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  Having cramping, congestion in chest. Coughing up mucous".

## 2016-04-05 ENCOUNTER — Inpatient Hospital Stay (HOSPITAL_COMMUNITY)
Admission: AD | Admit: 2016-04-05 | Discharge: 2016-04-05 | Disposition: A | Discharge status: Home / Self Care | Payer: Medicaid Other | Source: Ambulatory Visit | Attending: Obstetrics & Gynecology | Admitting: Obstetrics & Gynecology

## 2016-04-05 DIAGNOSIS — Z3A30 30 weeks gestation of pregnancy: Secondary | ICD-10-CM | POA: Diagnosis not present

## 2016-04-05 DIAGNOSIS — Z3493 Encounter for supervision of normal pregnancy, unspecified, third trimester: Secondary | ICD-10-CM | POA: Insufficient documentation

## 2016-04-05 LAB — GC/CHLAMYDIA PROBE AMP (~~LOC~~) NOT AT ARMC
Chlamydia: NEGATIVE
Neisseria Gonorrhea: NEGATIVE

## 2016-04-05 MED ORDER — BETAMETHASONE SOD PHOS & ACET 6 (3-3) MG/ML IJ SUSP
12.0000 mg | Freq: Once | INTRAMUSCULAR | Status: AC
  Administered 2016-04-05: 12 mg via INTRAMUSCULAR
  Filled 2016-04-05: qty 2

## 2016-04-12 ENCOUNTER — Inpatient Hospital Stay (HOSPITAL_COMMUNITY)
Admission: AD | Admit: 2016-04-12 | Discharge: 2016-04-12 | Disposition: A | Discharge status: Home / Self Care | Payer: Medicaid Other | Source: Ambulatory Visit | Attending: Obstetrics and Gynecology | Admitting: Obstetrics and Gynecology

## 2016-04-12 ENCOUNTER — Encounter (HOSPITAL_COMMUNITY): Payer: Self-pay

## 2016-04-12 DIAGNOSIS — R55 Syncope and collapse: Secondary | ICD-10-CM

## 2016-04-12 DIAGNOSIS — G43909 Migraine, unspecified, not intractable, without status migrainosus: Secondary | ICD-10-CM | POA: Insufficient documentation

## 2016-04-12 DIAGNOSIS — O4703 False labor before 37 completed weeks of gestation, third trimester: Secondary | ICD-10-CM | POA: Diagnosis not present

## 2016-04-12 DIAGNOSIS — IMO0002 Reserved for concepts with insufficient information to code with codable children: Secondary | ICD-10-CM

## 2016-04-12 DIAGNOSIS — O26893 Other specified pregnancy related conditions, third trimester: Secondary | ICD-10-CM

## 2016-04-12 DIAGNOSIS — D573 Sickle-cell trait: Secondary | ICD-10-CM | POA: Diagnosis not present

## 2016-04-12 DIAGNOSIS — J45909 Unspecified asthma, uncomplicated: Secondary | ICD-10-CM | POA: Diagnosis not present

## 2016-04-12 DIAGNOSIS — R109 Unspecified abdominal pain: Secondary | ICD-10-CM | POA: Diagnosis present

## 2016-04-12 DIAGNOSIS — Z3A30 30 weeks gestation of pregnancy: Secondary | ICD-10-CM | POA: Insufficient documentation

## 2016-04-12 DIAGNOSIS — O99343 Other mental disorders complicating pregnancy, third trimester: Secondary | ICD-10-CM | POA: Insufficient documentation

## 2016-04-12 DIAGNOSIS — F419 Anxiety disorder, unspecified: Secondary | ICD-10-CM | POA: Insufficient documentation

## 2016-04-12 DIAGNOSIS — R000 Tachycardia, unspecified: Secondary | ICD-10-CM

## 2016-04-12 DIAGNOSIS — O99513 Diseases of the respiratory system complicating pregnancy, third trimester: Secondary | ICD-10-CM | POA: Insufficient documentation

## 2016-04-12 DIAGNOSIS — R519 Headache, unspecified: Secondary | ICD-10-CM

## 2016-04-12 DIAGNOSIS — G43709 Chronic migraine without aura, not intractable, without status migrainosus: Secondary | ICD-10-CM

## 2016-04-12 DIAGNOSIS — O99353 Diseases of the nervous system complicating pregnancy, third trimester: Secondary | ICD-10-CM | POA: Insufficient documentation

## 2016-04-12 DIAGNOSIS — R51 Headache: Secondary | ICD-10-CM

## 2016-04-12 LAB — CBC
HEMATOCRIT: 36.7 % (ref 36.0–46.0)
Hemoglobin: 13.3 g/dL (ref 12.0–15.0)
MCH: 27.8 pg (ref 26.0–34.0)
MCHC: 36.2 g/dL — AB (ref 30.0–36.0)
MCV: 76.8 fL — AB (ref 78.0–100.0)
PLATELETS: 231 10*3/uL (ref 150–400)
RBC: 4.78 MIL/uL (ref 3.87–5.11)
RDW: 14 % (ref 11.5–15.5)
WBC: 14 10*3/uL — ABNORMAL HIGH (ref 4.0–10.5)

## 2016-04-12 LAB — URINALYSIS, ROUTINE W REFLEX MICROSCOPIC
BILIRUBIN URINE: NEGATIVE
GLUCOSE, UA: NEGATIVE mg/dL
HGB URINE DIPSTICK: NEGATIVE
Ketones, ur: NEGATIVE mg/dL
Leukocytes, UA: NEGATIVE
Nitrite: NEGATIVE
PH: 6 (ref 5.0–8.0)
Protein, ur: NEGATIVE mg/dL

## 2016-04-12 LAB — GLUCOSE, CAPILLARY: Glucose-Capillary: 82 mg/dL (ref 65–99)

## 2016-04-12 MED ORDER — BUTALBITAL-APAP-CAFFEINE 50-325-40 MG PO TABS: 1.0000 | ORAL_TABLET | Freq: Four times a day (QID) | ORAL | 1 refills | Status: DC | PRN

## 2016-04-12 MED ORDER — NIFEDIPINE 10 MG PO CAPS
20.0000 mg | ORAL_CAPSULE | Freq: Once | ORAL | Status: AC
  Administered 2016-04-12: 20 mg via ORAL
  Filled 2016-04-12: qty 2

## 2016-04-12 MED ORDER — ACETAMINOPHEN 500 MG PO TABS
1000.0000 mg | ORAL_TABLET | Freq: Once | ORAL | Status: AC
  Administered 2016-04-12: 1000 mg via ORAL
  Filled 2016-04-12: qty 2

## 2016-04-12 NOTE — MAU Provider Note (Signed)
History     CSN: [id number]  Arrival date and time: 04/12/16 [id number]   First Provider Initiated Contact with Patient 04/12/16 1827      Chief Complaint  Patient presents with  . Contractions   HPI Kathleen Spears is a 20 y.o. G1P0 at 2940w6d who presents with abdominal pain. Patient reports contractions every 2-3 minutes since 3 pm today. Rates pain 10/10. Has not treated pain. Pt previously seen in MAU last week; had + FFN & closed cervix. Was given BMZ & sent home on procardia (pt states she was not sent home on medication for ctx). Was seen in office by Dr. Jackelyn KnifeMeisinger yesterday & told cervix dilated 1 cm.  Pt also reports headache that started since being in MAU. Rates headache 10/10. Frontal, dull headache like previous headaches.  Denies vaginal bleeding, recent intercourse, LOF. Positive fetal movement.   OB History    Gravida Para Term Preterm AB Living   1             SAB TAB Ectopic Multiple Live Births                  Past Medical History:  Diagnosis Date  . Anxiety attack   . Asthma   . Migraines   . Sickle-cell trait [facility](HCC)     Past Surgical History:  Procedure Laterality Date  . TONSILLECTOMY    . WISDOM TOOTH EXTRACTION      Family History  Problem Relation Age of Onset  . Bone cancer Mother     Social History  Substance Use Topics  . Smoking status: Never Smoker  . Smokeless tobacco: Never Used  . Alcohol use No     Comment: social    Allergies: No Known Allergies  Prescriptions Prior to Admission  Medication Sig Dispense Refill Last Dose  . albuterol (PROVENTIL HFA;VENTOLIN HFA) 108 (90 BASE) MCG/ACT inhaler Inhale 2 puffs into the lungs every 6 (six) hours as needed. For shortness of breath   rescue  . Doxylamine-Pyridoxine (DICLEGIS PO) Take 1 tablet by mouth daily as needed (nausea).   04/04/2016 at Unknown time  . NIFEdipine (PROCARDIA) 10 MG capsule Take 1 capsule (10 mg total) by mouth 3 (three) times daily. 21 capsule 0   . Prenatal Vit-Fe  Fumarate-FA (PRENATAL MULTIVITAMIN) TABS tablet Take 1 tablet by mouth daily at 12 noon.   04/04/2016 at Unknown time    Review of Systems  Constitutional: Negative.   Gastrointestinal: Positive for abdominal pain. Negative for constipation, diarrhea, nausea and vomiting.  Genitourinary: Negative.   Neurological: Positive for headaches.   Physical Exam   Patient Vitals for the past 24 hrs:  BP Temp Temp src Pulse Resp Weight  04/12/16 2201 119/59 - - 105 16 -  04/12/16 1744 117/64 98.9 F (37.2 C) Oral 113 16 126 lb 9.6 oz (57.4 kg)     Physical Exam  Nursing note and vitals reviewed. Constitutional: She is oriented to person, place, and time. She appears well-developed and well-nourished. No distress.  HENT:  Head: Normocephalic and atraumatic.  Eyes: Conjunctivae are normal. Right eye exhibits no discharge. Left eye exhibits no discharge. No scleral icterus.  Neck: Normal range of motion.  Cardiovascular:  No murmur heard. Respiratory: Effort normal. No respiratory distress.  GI: Soft. There is no tenderness.  Ctx not palpated; abdomen soft  Neurological: She is alert and oriented to person, place, and time.  Skin: Skin is warm and dry. She is not diaphoretic.  Psychiatric: She has a normal mood and affect. Her behavior is normal. Judgment and thought content normal.   Dilation: 1 Effacement (%): Thick Cervical Position: Posterior Exam by:: Judeth HornErin Lawrence NP   Fetal Tracing:  Baseline: 145 Variability: moderate Accelerations: 15x15 Decelerations: none  Toco: irregular, every ~10 mins   MAU Course  Procedures Results for orders placed or performed during the hospital encounter of 04/12/16 (from the past 24 hour(s))  Urinalysis, Routine w reflex microscopic (not [facility])     Status: Abnormal   Collection Time: 04/12/16  6:04 PM  Result Value Ref Range   Color, Urine YELLOW YELLOW   APPearance CLEAR CLEAR   Specific Gravity, Urine <1.005 (L) 1.005 - 1.030   pH  6.0 5.0 - 8.0   Glucose, UA NEGATIVE NEGATIVE mg/dL   Hgb urine dipstick NEGATIVE NEGATIVE   Bilirubin Urine NEGATIVE NEGATIVE   Ketones, ur NEGATIVE NEGATIVE mg/dL   Protein, ur NEGATIVE NEGATIVE mg/dL   Nitrite NEGATIVE NEGATIVE   Leukocytes, UA NEGATIVE NEGATIVE  CBC     Status: Abnormal   Collection Time: 04/12/16  8:11 PM  Result Value Ref Range   WBC 14.0 (H) 4.0 - 10.5 K/uL   RBC 4.78 3.87 - 5.11 MIL/uL   Hemoglobin 13.3 12.0 - 15.0 g/dL   HCT 45.436.7 09.836.0 - 11.946.0 %   MCV 76.8 (L) 78.0 - 100.0 fL   MCH 27.8 26.0 - 34.0 pg   MCHC 36.2 (H) 30.0 - 36.0 g/dL   RDW 14.714.0 82.911.5 - 56.215.5 %   Platelets 231 150 - 400 K/uL  Glucose, capillary     Status: None   Collection Time: 04/12/16  8:47 PM  Result Value Ref Range   Glucose-Capillary 82 65 - 99 mg/dL   EKG shows sinus tachycardia (109 bpm) nonspecific T-wave abnormality.  MDM Reactive fetal tracing Pt in no apparent distress; laughing & playing on phone while NP & RN in room Tylenol 1 gm & procardia 20 mg PO hydration SVE unchanged since yesterday S/w Dr. Jackelyn KnifeMeisinger; ok to discharge home & encourage PO hydration at home  At time of discharge pt states that she wants to be evaluated for her episode of dizziness & hot flashes which were not mentioned to me previously. States she passes out at work "all the time" (last time being over a week ago when she was seen in MAU). Today felt dizzy & hot at work today with some chest tightening. States she talked to Dr. Jackelyn KnifeMeisinger about these symptoms yesterday but "doesn't remember" what he said about it.   CBC & CBG ordered Care turned over to [facility] Starling Jessie CNM        Judeth HornErin Lawrence, NP 04/12/2016 8:05 PM   Dorathy KinsmanVirginia Wanell Lorenzi, CNM assumed care of patient at 8:05 PM.  Patient still rates her abdominal pain and headache 10/10 on pain scale. Is in no apparent distress. Cervical exam repeated. Unchanged--1/long/posterior. Patient states headaches are like her chronic migraines. No thunderclap  headache, visual changes or difficulties with speech or gait. Used to go to neurologist for migraines but has not been going during pregnancy. Migraines usually controlled by Topamax and ibuprofen, but patient unable to take either this medications now due to pregnancy. Offered to prescribe Fioricet. May take 4 hours after last Tylenol dose. Recommend that she follow-up with neurologist if Fioricet is not helping or emergency department for severe headaches worsen chronic migraines. Discuss issues with dizziness and near syncope. Symptoms not present now. Only seem to occur  when she's at work. Discussed essentially contractions, headache, near syncope, dizziness, labs, pelvic exam and EKG with Dr. Jackelyn Knife. Cardiology consult not necessary per Dr. Jackelyn Knife. No indication for taking patient out of work at this time recommended that she stay well-hydrated and have frequent snacks sitdown as needed at work. Can discuss work accommodations or further request for being taken out of work at next prenatal visit. No further evaluation of these problems needed per Dr. Jackelyn Knife. Patient may be discharged.  Assessment and Plan   1. Preterm uterine contractions in third trimester, antepartum   2. Headache in pregnancy, antepartum, third trimester   3. Chronic migraine   4. Near syncope   5.      Tachycardia  Discharge home in stable condition per consult with Dr. Jackelyn Knife. Headache red flags reviewed. Rx Fioricet. Follow up with neurology if migraines are not adequately controlled with Fioricet. Preterm labor precautions. Take Procardia as instructed. Dr. Jackelyn Knife sent a new prescription yesterday. Increase fluids and rest. Lengthy discussion about normal discomforts of pregnancy versus signs of complications. Follow-up Information    [facility] Follow up.   Why:  for scheduled prenatal care or call as needed Contact information: [address]  [address] [place]  [address] [phone]        [facility] ADMISS[facility] Follow up.   Why:  as needed in emergencies Contact information: [address] [id number] mc [place] [address] [phone]           Medication List    TAKE these medications   albuterol 108 (90 Base) MCG/ACT inhaler Commonly known as:  PROVENTIL HFA;VENTOLIN HFA Inhale 2 puffs into the lungs every 6 (six) hours as needed for wheezing or shortness of breath.   butalbital-acetaminophen-caffeine 50-325-40 MG tablet Commonly known as:  FIORICET, ESGIC Take 1-2 tablets by mouth every 6 (six) hours as needed for headache.   DICLEGIS 10-10 MG Tbec Generic drug:  Doxylamine-Pyridoxine Take 1 tablet by mouth daily.   NIFEdipine 10 MG capsule Commonly known as:  PROCARDIA Take 1 capsule (10 mg total) by mouth 3 (three) times daily.   PRENATAL GUMMIES/DHA & FA 0.4-32.5 MG Chew Chew 2 each by mouth daily.       [place], CNM 04/12/2016 10:41 PM

## 2016-04-12 NOTE — Discharge Instructions (Signed)
. °Preterm Labor and Birth Information °The normal length of a pregnancy is 39-41 weeks. Preterm labor is when labor starts before 37 completed weeks of pregnancy. °What are the risk factors for preterm labor? °Preterm labor is more likely to occur in women who: °· Have certain infections during pregnancy such as a bladder infection, sexually transmitted infection, or infection inside the uterus (chorioamnionitis). °· Have a shorter-than-normal cervix. °· Have gone into preterm labor before. °· Have had surgery on their cervix. °· Are younger than age 17 or older than age 35. °· Are African American. °· Are pregnant with twins or multiple babies (multiple gestation). °· Take street drugs or smoke while pregnant. °· Do not gain enough weight while pregnant. °· Became pregnant shortly after having been pregnant. °What are the symptoms of preterm labor? °Symptoms of preterm labor include: °· Cramps similar to those that can happen during a menstrual period. The cramps may happen with diarrhea. °· Pain in the abdomen or lower back. °· Regular uterine contractions that may feel like tightening of the abdomen. °· A feeling of increased pressure in the pelvis. °· Increased watery or bloody mucus discharge from the vagina. °· Water breaking (ruptured amniotic sac). °Why is it important to recognize signs of preterm labor? °It is important to recognize signs of preterm labor because babies who are born prematurely may not be fully developed. This can put them at an increased risk for: °· Long-term (chronic) heart and lung problems. °· Difficulty immediately after birth with regulating body systems, including blood sugar, body temperature, heart rate, and breathing rate. °· Bleeding in the brain. °· Cerebral palsy. °· Learning difficulties. °· Death. °These risks are highest for babies who are born before 34 weeks of pregnancy. °How is preterm labor treated? °Treatment depends on the length of your pregnancy, your condition,  and the health of your baby. It may involve: °· Having a stitch (suture) placed in your cervix to prevent your cervix from opening too early (cerclage). °· Taking or being given medicines, such as: °¨ Hormone medicines. These may be given early in pregnancy to help support the pregnancy. °¨ Medicine to stop contractions. °¨ Medicines to help mature the baby’s lungs. These may be prescribed if the risk of delivery is high. °¨ Medicines to prevent your baby from developing cerebral palsy. °If the labor happens before 34 weeks of pregnancy, you may need to stay in the hospital. °What should I do if I think I am in preterm labor? °If you think that you are going into preterm labor, call your health care provider right away. °How can I prevent preterm labor in future pregnancies? °To increase your chance of having a full-term pregnancy: °· Do not use any tobacco products, such as cigarettes, chewing tobacco, and e-cigarettes. If you need help quitting, ask your health care provider. °· Do not use street drugs or medicines that have not been prescribed to you during your pregnancy. °· Talk with your health care provider before taking any herbal supplements, even if you have been taking them regularly. °· Make sure you gain a healthy amount of weight during your pregnancy. °· Watch for infection. If you think that you might have an infection, get it checked right away. °· Make sure to tell your health care provider if you have gone into preterm labor before. °This information is not intended to replace advice given to you by your health care provider. Make sure you discuss any questions you have with your   care provider. Document Released: 07/23/2003 Document Revised: 10/13/2015 Document Reviewed: 09/23/2015 Elsevier Interactive Patient Education  2017 Elsevier Inc.    Introduction Patient Name: ________________________________________________ Patient Due Date: ____________________ What is a fetal movement  count? A fetal movement count is the number of times that you feel your baby move during a certain amount of time. This may also be called a fetal kick count. A fetal movement count is recommended for every pregnant woman. You may be asked to start counting fetal movements as early as week 28 of your pregnancy. Pay attention to when your baby is most active. You may notice your baby's sleep and wake cycles. You may also notice things that make your baby move more. You should do a fetal movement count:  When your baby is normally most active.  At the same time each day. A good time to count movements is while you are resting, after having something to eat and drink. How do I count fetal movements? 1. Find a quiet, comfortable area. Sit, or lie down on your side. 2. Write down the date, the start time and stop time, and the number of movements that you felt between those two times. Take this information with you to your health care visits. 3. For 2 hours, count kicks, flutters, swishes, rolls, and jabs. You should feel at least 10 movements during 2 hours. 4. You may stop counting after you have felt 10 movements. 5. If you do not feel 10 movements in 2 hours, have something to eat and drink. Then, keep resting and counting for 1 hour. If you feel at least 4 movements during that hour, you may stop counting. Contact a health care provider if:  You feel fewer than 4 movements in 2 hours.  Your baby is not moving like he or she usually does. Date: ____________ Start time: ____________ Stop time: ____________ Movements: ____________ Date: ____________ Start time: ____________ Stop time: ____________ Movements: ____________ Date: ____________ Start time: ____________ Stop time: ____________ Movements: ____________ Date: ____________ Start time: ____________ Stop time: ____________ Movements: ____________ Date: ____________ Start time: ____________ Stop time: ____________ Movements:  ____________ Date: ____________ Start time: ____________ Stop time: ____________ Movements: ____________ Date: ____________ Start time: ____________ Stop time: ____________ Movements: ____________ Date: ____________ Start time: ____________ Stop time: ____________ Movements: ____________ Date: ____________ Start time: ____________ Stop time: ____________ Movements: ____________ This information is not intended to replace advice given to you by your health care provider. Make sure you discuss any questions you have with your health care provider. Document Released: 06/01/2006 Document Revised: 12/30/2015 Document Reviewed: 06/11/2015 Elsevier Interactive Patient Education  2017 [employer]

## 2016-04-12 NOTE — MAU Note (Signed)
Feeling pressure and contractions.  Had been on bedrest.  Today was 2nd day back at work.   Contractions stopped and restarted. Was 1 cm yesterday. Has had betamethasone

## 2016-04-13 NOTE — Progress Notes (Signed)
FHT from 11-28 reviewed.  Reactive tracing, no significant decels, irreg ctx.

## 2016-05-02 ENCOUNTER — Encounter (HOSPITAL_COMMUNITY): Payer: Self-pay | Admitting: *Deleted

## 2016-05-02 ENCOUNTER — Inpatient Hospital Stay (HOSPITAL_COMMUNITY)
Admission: AD | Admit: 2016-05-02 | Discharge: 2016-05-02 | Disposition: A | Discharge status: Home / Self Care | Payer: Medicaid Other | Source: Ambulatory Visit | Attending: Obstetrics and Gynecology | Admitting: Obstetrics and Gynecology

## 2016-05-02 DIAGNOSIS — O4703 False labor before 37 completed weeks of gestation, third trimester: Secondary | ICD-10-CM | POA: Diagnosis not present

## 2016-05-02 DIAGNOSIS — Z3A33 33 weeks gestation of pregnancy: Secondary | ICD-10-CM | POA: Diagnosis not present

## 2016-05-02 DIAGNOSIS — G43909 Migraine, unspecified, not intractable, without status migrainosus: Secondary | ICD-10-CM | POA: Diagnosis not present

## 2016-05-02 DIAGNOSIS — O26893 Other specified pregnancy related conditions, third trimester: Secondary | ICD-10-CM | POA: Diagnosis not present

## 2016-05-02 DIAGNOSIS — R109 Unspecified abdominal pain: Secondary | ICD-10-CM | POA: Diagnosis not present

## 2016-05-02 DIAGNOSIS — J45909 Unspecified asthma, uncomplicated: Secondary | ICD-10-CM | POA: Diagnosis not present

## 2016-05-02 DIAGNOSIS — O2343 Unspecified infection of urinary tract in pregnancy, third trimester: Secondary | ICD-10-CM | POA: Diagnosis not present

## 2016-05-02 DIAGNOSIS — D573 Sickle-cell trait: Secondary | ICD-10-CM | POA: Diagnosis not present

## 2016-05-02 DIAGNOSIS — O99513 Diseases of the respiratory system complicating pregnancy, third trimester: Secondary | ICD-10-CM | POA: Insufficient documentation

## 2016-05-02 DIAGNOSIS — R102 Pelvic and perineal pain: Secondary | ICD-10-CM | POA: Diagnosis present

## 2016-05-02 LAB — URINALYSIS, ROUTINE W REFLEX MICROSCOPIC
Bilirubin Urine: NEGATIVE
Glucose, UA: NEGATIVE mg/dL
HGB URINE DIPSTICK: NEGATIVE
Ketones, ur: NEGATIVE mg/dL
Nitrite: NEGATIVE
PROTEIN: 30 mg/dL — AB
SPECIFIC GRAVITY, URINE: 1.019 (ref 1.005–1.030)
pH: 5 (ref 5.0–8.0)

## 2016-05-02 LAB — FETAL FIBRONECTIN: FETAL FIBRONECTIN: POSITIVE — AB

## 2016-05-02 MED ORDER — BETAMETHASONE SOD PHOS & ACET 6 (3-3) MG/ML IJ SUSP
12.0000 mg | Freq: Once | INTRAMUSCULAR | Status: AC
Start: 2016-05-02 — End: 2016-05-02
  Administered 2016-05-02: 12 mg via INTRAMUSCULAR
  Filled 2016-05-02: qty 2

## 2016-05-02 MED ORDER — NIFEDIPINE 10 MG PO CAPS
20.0000 mg | ORAL_CAPSULE | Freq: Once | ORAL | Status: AC
  Administered 2016-05-02: 20 mg via ORAL
  Filled 2016-05-02: qty 2

## 2016-05-02 MED ORDER — NIFEDIPINE 10 MG PO CAPS
10.0000 mg | ORAL_CAPSULE | Freq: Once | ORAL | Status: AC
  Administered 2016-05-02: 10 mg via ORAL
  Filled 2016-05-02: qty 1

## 2016-05-02 MED ORDER — NIFEDIPINE 10 MG PO CAPS: 20.0000 mg | ORAL_CAPSULE | ORAL | Status: DC

## 2016-05-02 MED ORDER — CEPHALEXIN 500 MG PO CAPS: 500.0000 mg | ORAL_CAPSULE | Freq: Four times a day (QID) | ORAL | 0 refills | Status: DC

## 2016-05-02 NOTE — Discharge Instructions (Signed)
Braxton Hicks Contractions °Contractions of the uterus can occur throughout pregnancy. Contractions are not always a sign that you are in labor.  °WHAT ARE BRAXTON HICKS CONTRACTIONS?  °Contractions that occur before labor are called Braxton Hicks contractions, or false labor. Toward the end of pregnancy (32-34 weeks), these contractions can develop more often and may become more forceful. This is not true labor because these contractions do not result in opening (dilatation) and thinning of the cervix. They are sometimes difficult to tell apart from true labor because these contractions can be forceful and people have different pain tolerances. You should not feel embarrassed if you go to the hospital with false labor. Sometimes, the only way to tell if you are in true labor is for your health care provider to look for changes in the cervix. °If there are no prenatal problems or other health problems associated with the pregnancy, it is completely safe to be sent home with false labor and await the onset of true labor. °HOW CAN YOU TELL THE DIFFERENCE BETWEEN TRUE AND FALSE LABOR? °False Labor  °· The contractions of false labor are usually shorter and not as hard as those of true labor.   °· The contractions are usually irregular.   °· The contractions are often felt in the front of the lower abdomen and in the groin.   °· The contractions may go away when you walk around or change positions while lying down.   °· The contractions get weaker and are shorter lasting as time goes on.   °· The contractions do not usually become progressively stronger, regular, and closer together as with true labor.   °True Labor  °· Contractions in true labor last 30-70 seconds, become very regular, usually become more intense, and increase in frequency.   °· The contractions do not go away with walking.   °· The discomfort is usually felt in the top of the uterus and spreads to the lower abdomen and low back.   °· True labor can be  determined by your health care provider with an exam. This will show that the cervix is dilating and getting thinner.   °WHAT TO REMEMBER °· Keep up with your usual exercises and follow other instructions given by your health care provider.   °· Take medicines as directed by your health care provider.   °· Keep your regular prenatal appointments.   °· Eat and drink lightly if you think you are going into labor.   °· If Braxton Hicks contractions are making you uncomfortable:   °¨ Change your position from lying down or resting to walking, or from walking to resting.   °¨ Sit and rest in a tub of warm water.   °¨ Drink 2-3 glasses of water. Dehydration may cause these contractions.   °¨ Do slow and deep breathing several times an hour.   °WHEN SHOULD I SEEK IMMEDIATE MEDICAL CARE? °Seek immediate medical care if: °· Your contractions become stronger, more regular, and closer together.   °· You have fluid leaking or gushing from your vagina.   °· You have a fever.   °· You pass blood-tinged mucus.   °· You have vaginal bleeding.   °· You have continuous abdominal pain.   °· You have low back pain that you never had before.   °· You feel your baby's head pushing down and causing pelvic pressure.   °· Your baby is not moving as much as it used to.   °This information is not intended to replace advice given to you by your health care provider. Make sure you discuss any questions you have with your health care   provider. °Document Released: 05/02/2005 Document Revised: 08/24/2015 Document Reviewed: 02/11/2013 °Elsevier Interactive Patient Education © 2017 Elsevier Inc. ° °Preventing Preterm Birth °Preterm birth is when your baby is delivered between 20 weeks and 37 weeks of pregnancy. A full-term pregnancy lasts for at least 37 weeks. Preterm birth can be dangerous for your baby because the last few weeks of pregnancy are an important time for your baby's brain and lungs to grow. Many things can cause a baby to be born  early. Sometimes the cause is not known. There are certain factors that make you more likely to experience preterm birth, such as: °· Having a previous baby born preterm. °· Being pregnant with twins or other multiples. °· Having had fertility treatment. °· Being overweight or underweight at the start of your pregnancy. °· Having any of the following during pregnancy: °¨ An infection, including a urinary tract infection (UTI) or an STI (sexually transmitted infection). °¨ High blood pressure. °¨ Diabetes. °¨ Vaginal bleeding. °· Being age 35 or older. °· Being age 18 or younger. °· Getting pregnant within 6 months of a previous pregnancy. °· Suffering extreme stress or physical or emotional abuse during pregnancy. °· Standing for long periods of time during pregnancy, such as working at a job that requires standing. °What are the risks? °The most serious risk of preterm birth is that the baby may not survive. This is more likely to happen if a baby is born before 34 weeks. Other risks and complications of preterm birth may include your baby having: °· Breathing problems. °· Brain damage that affects movement and coordination (cerebral palsy). °· Feeding difficulties. °· Vision or hearing problems. °· Infections or inflammation of the digestive tract (colitis). °· Developmental delays. °· Learning disabilities. °· Higher risk for diabetes, heart disease, and high blood pressure later in life. °What can I do to lower my risk? °Medical care °The most important thing you can do to lower your risk for preterm birth is to get routine medical care during pregnancy (prenatal care). If you have a high risk of preterm birth, you may be referred to a health care provider who specializes in managing high-risk pregnancies (perinatologist). You may be given medicine to help prevent preterm birth. °Lifestyle changes °Certain lifestyle changes can also lower your risk of preterm birth: °· Wait at least 6 months after a pregnancy to  become pregnant again. °· Try to plan pregnancy for when you are between 19 and 35 years old. °· Get to a healthy weight before getting pregnant. If you are overweight, work with your health care provider to safely lose weight. °· Do not use any products that contain nicotine or tobacco, such as cigarettes and e-cigarettes. If you need help quitting, ask your health care provider. °· Do not drink alcohol. °· Do not use drugs. °Where to find support: °For more support, consider: °· Talking with your health care provider. °· Talking with a therapist or substance abuse counselor, if you need help quitting. °· Working with a diet and nutrition specialist (dietitian) or a personal trainer to maintain a healthy weight. °· Joining a support group. °Where to find more information: °Learn more about preventing preterm birth from: °· Centers for Disease Control and Prevention: cdc.gov/reproductivehealth/maternalinfanthealth/pretermbirth.htm °· March of Dimes: marchofdimes.org/complications/premature-babies.aspx °· American Pregnancy Association: americanpregnancy.org/labor-and-birth/premature-labor °Contact a health care provider if: °· You have any of the following signs of preterm labor before 37 weeks: °¨ A change or increase in vaginal discharge. °¨ Fluid leaking from your vagina. °¨   Pressure or cramps in your lower abdomen. °¨ A backache that does not go away or gets worse. °¨ Regular tightening (contractions) in your lower abdomen. °Summary °· Preterm birth means having your baby during weeks 20-37 of pregnancy. °· Preterm birth may put your baby at risk for physical and mental problems. °· Getting good prenatal care can help prevent preterm birth. °· You can lower your risk of preterm birth by making certain lifestyle changes, such as not smoking and not using alcohol. °This information is not intended to replace advice given to you by your health care provider. Make sure you discuss any questions you have with your  health care provider. °Document Released: 06/16/2015 Document Revised: 01/09/2016 Document Reviewed: 01/09/2016 °Elsevier Interactive Patient Education © 2017 Elsevier Inc. ° °

## 2016-05-02 NOTE — MAU Provider Note (Signed)
History     CSN: [id number]  Arrival date and time: 05/02/16 1119   First Provider Initiated Contact with Patient 05/02/16 1215      Chief Complaint  Patient presents with  . Pelvic Pain   HPI   Kathleen Spears is a 20 y.o. female G1P0 @ 6184w5d with a history of preterm labor,  here in MAU with pelvic pain. The pain started 3 days ago and worsened today. The pain was worse on Friday however she was at work and was trying to hold off. She felt that if she pushed that the baby would come out. The pain comes and goes.  Patient had a positive FFN and received betamethasone on 11/20. She was sent home with procardia and has been taking it as needed. She last took her dose 2 hours ago. She does not feel like this helps.   Denies vaginal bleeding. Denies leaking of water + fetal movement.   OB History    Gravida Para Term Preterm AB Living   1             SAB TAB Ectopic Multiple Live Births                  Past Medical History:  Diagnosis Date  . Anxiety attack   . Asthma   . Migraines   . Sickle-cell trait [facility](HCC)     Past Surgical History:  Procedure Laterality Date  . TONSILLECTOMY    . WISDOM TOOTH EXTRACTION      Family History  Problem Relation Age of Onset  . Bone cancer Mother     Social History  Substance Use Topics  . Smoking status: Never Smoker  . Smokeless tobacco: Never Used  . Alcohol use No     Comment: social    Allergies: No Known Allergies  Prescriptions Prior to Admission  Medication Sig Dispense Refill Last Dose  . butalbital-acetaminophen-caffeine (FIORICET, ESGIC) 50-325-40 MG tablet Take 1-2 tablets by mouth every 6 (six) hours as needed for headache. 30 tablet 1 Past Week at Unknown time  . NIFEdipine (PROCARDIA) 10 MG capsule Take 1 capsule (10 mg total) by mouth 3 (three) times daily. 21 capsule 0 05/02/2016 at Unknown time  . Prenatal MV-Min-FA-Omega-3 (PRENATAL GUMMIES/DHA & FA) 0.4-32.5 MG CHEW Chew 2 each by mouth daily.    05/02/2016 at Unknown time  . albuterol (PROVENTIL HFA;VENTOLIN HFA) 108 (90 BASE) MCG/ACT inhaler Inhale 2 puffs into the lungs every 6 (six) hours as needed for wheezing or shortness of breath.    rescue   Results for orders placed or performed during the hospital encounter of 05/02/16 (from the past 48 hour(s))  Urinalysis, Routine w reflex microscopic     Status: Abnormal   Collection Time: 05/02/16 12:07 PM  Result Value Ref Range   Color, Urine AMBER (A) YELLOW    Comment: BIOCHEMICALS MAY BE AFFECTED BY COLOR   APPearance CLOUDY (A) CLEAR   Specific Gravity, Urine 1.019 1.005 - 1.030   pH 5.0 5.0 - 8.0   Glucose, UA NEGATIVE NEGATIVE mg/dL   Hgb urine dipstick NEGATIVE NEGATIVE   Bilirubin Urine NEGATIVE NEGATIVE   Ketones, ur NEGATIVE NEGATIVE mg/dL   Protein, ur 30 (A) NEGATIVE mg/dL   Nitrite NEGATIVE NEGATIVE   Leukocytes, UA MODERATE (A) NEGATIVE   RBC / HPF 0-5 0 - 5 RBC/hpf   WBC, UA TOO NUMEROUS TO COUNT 0 - 5 WBC/hpf   Bacteria, UA MANY (A) NONE SEEN  Squamous Epithelial / LPF 0-5 (A) NONE SEEN   Mucous PRESENT    Ca Oxalate Crys, UA PRESENT   Fetal fibronectin     Status: Abnormal   Collection Time: 05/02/16 12:20 PM  Result Value Ref Range   Fetal Fibronectin POSITIVE (A) NEGATIVE    Review of Systems  Constitutional: Negative for chills and fever.  Genitourinary: Negative for dysuria and urgency.   Physical Exam   Blood pressure 114/73, pulse 110, temperature 97.8 F (36.6 C), temperature source Oral, resp. rate 18.  Physical Exam  Constitutional: She is oriented to person, place, and time. She appears well-developed and well-nourished. No distress.  GI: Soft. She exhibits no distension. There is no tenderness. There is no rebound.  Genitourinary:  Genitourinary Comments: Dilation: 1 Effacement (%): 50, cervix is soft Cervical Position: Posterior Exam by:: Jerrye BushyJ Rausch NP  Musculoskeletal: Normal range of motion.  Neurological: She is alert and  oriented to person, place, and time.  Skin: Skin is warm. She is not diaphoretic.  Psychiatric: Her behavior is normal.   Fetal Tracing: Baseline: 125 bpm  Variability: Moderate  Accelerations: 15x15 Decelerations: Occasional quick variable  Toco: Irregular pattern with UI   MAU Course  Procedures  None  MDM  Initial cervical exam difficult due to patient's discomfort. Will given procardia and recheck  Reactive NST Pt in no apparent distress; playing on phone while NP & RN in room Patient refused tylenol  Procardia 20 mg then procardia 10 mg Urine culture pending  Discussed patient with Dr. Ellyn HackBovard. Will repeat betamethasone today and repeat in 24 hours.   Assessment and Plan   A:  1. Threatened premature labor in third trimester   2. Abdominal pain in pregnancy, third trimester   3. UTI in pregnancy, antepartum, third trimester     P:  Discharge home in stable condition Continue procardia as directed on the bottle Return tomorrow for 2nd betamethasone injection Rx: Keflex Strict return precautions, strict preterm labor precautions.  Urine culture pending   Duane LopeJennifer I Latavion Halls, NP 05/02/2016  4:19 PM

## 2016-05-02 NOTE — MAU Note (Signed)
Pt C/O pelvic pressure & pain that started this morning, also had pressure on Friday that went away.  Denies bleeding or LOF, lost mucus plug on Thursday.

## 2016-05-03 ENCOUNTER — Inpatient Hospital Stay (HOSPITAL_COMMUNITY)
Admission: AD | Admit: 2016-05-03 | Discharge: 2016-05-03 | Disposition: A | Discharge status: Home / Self Care | Payer: Medicaid Other | Source: Ambulatory Visit | Attending: Obstetrics and Gynecology | Admitting: Obstetrics and Gynecology

## 2016-05-03 DIAGNOSIS — Z3493 Encounter for supervision of normal pregnancy, unspecified, third trimester: Secondary | ICD-10-CM | POA: Insufficient documentation

## 2016-05-03 DIAGNOSIS — Z3A34 34 weeks gestation of pregnancy: Secondary | ICD-10-CM | POA: Diagnosis not present

## 2016-05-03 LAB — CULTURE, OB URINE
Culture: NO GROWTH
Special Requests: NORMAL

## 2016-05-03 MED ORDER — BETAMETHASONE SOD PHOS & ACET 6 (3-3) MG/ML IJ SUSP
12.0000 mg | Freq: Once | INTRAMUSCULAR | Status: AC
  Administered 2016-05-03: 12 mg via INTRAMUSCULAR
  Filled 2016-05-03: qty 2

## 2016-05-06 ENCOUNTER — Encounter (HOSPITAL_COMMUNITY): Payer: Self-pay

## 2016-05-06 ENCOUNTER — Inpatient Hospital Stay (HOSPITAL_COMMUNITY)
Admission: AD | Admit: 2016-05-06 | Discharge: 2016-05-06 | Disposition: A | Discharge status: Home / Self Care | Payer: Medicaid Other | Source: Ambulatory Visit | Attending: Obstetrics and Gynecology | Admitting: Obstetrics and Gynecology

## 2016-05-06 DIAGNOSIS — Z3A34 34 weeks gestation of pregnancy: Secondary | ICD-10-CM | POA: Diagnosis not present

## 2016-05-06 DIAGNOSIS — N949 Unspecified condition associated with female genital organs and menstrual cycle: Secondary | ICD-10-CM

## 2016-05-06 DIAGNOSIS — K219 Gastro-esophageal reflux disease without esophagitis: Secondary | ICD-10-CM | POA: Insufficient documentation

## 2016-05-06 DIAGNOSIS — O99513 Diseases of the respiratory system complicating pregnancy, third trimester: Secondary | ICD-10-CM | POA: Insufficient documentation

## 2016-05-06 DIAGNOSIS — R102 Pelvic and perineal pain: Secondary | ICD-10-CM | POA: Diagnosis not present

## 2016-05-06 DIAGNOSIS — R109 Unspecified abdominal pain: Secondary | ICD-10-CM | POA: Diagnosis present

## 2016-05-06 DIAGNOSIS — J45909 Unspecified asthma, uncomplicated: Secondary | ICD-10-CM | POA: Insufficient documentation

## 2016-05-06 DIAGNOSIS — O26893 Other specified pregnancy related conditions, third trimester: Secondary | ICD-10-CM | POA: Insufficient documentation

## 2016-05-06 DIAGNOSIS — O99613 Diseases of the digestive system complicating pregnancy, third trimester: Secondary | ICD-10-CM | POA: Insufficient documentation

## 2016-05-06 DIAGNOSIS — D573 Sickle-cell trait: Secondary | ICD-10-CM | POA: Diagnosis not present

## 2016-05-06 DIAGNOSIS — G43909 Migraine, unspecified, not intractable, without status migrainosus: Secondary | ICD-10-CM | POA: Diagnosis not present

## 2016-05-06 DIAGNOSIS — Z3689 Encounter for other specified antenatal screening: Secondary | ICD-10-CM

## 2016-05-06 LAB — COMPREHENSIVE METABOLIC PANEL
ALBUMIN: 3.4 g/dL — AB (ref 3.5–5.0)
ALT: 25 U/L (ref 14–54)
ANION GAP: 10 (ref 5–15)
AST: 32 U/L (ref 15–41)
Alkaline Phosphatase: 155 U/L — ABNORMAL HIGH (ref 38–126)
BUN: 7 mg/dL (ref 6–20)
CHLORIDE: 103 mmol/L (ref 101–111)
CO2: 20 mmol/L — AB (ref 22–32)
Calcium: 9.2 mg/dL (ref 8.9–10.3)
Creatinine, Ser: 0.66 mg/dL (ref 0.44–1.00)
GFR calc non Af Amer: >60 mL/min (ref >60)
GLUCOSE: 102 mg/dL — AB (ref 65–99)
Potassium: 3.4 mmol/L — ABNORMAL LOW (ref 3.5–5.1)
SODIUM: 133 mmol/L — AB (ref 135–145)
Total Bilirubin: 0.8 mg/dL (ref 0.3–1.2)
Total Protein: 6.6 g/dL (ref 6.5–8.1)

## 2016-05-06 LAB — URINALYSIS, ROUTINE W REFLEX MICROSCOPIC
BILIRUBIN URINE: NEGATIVE
GLUCOSE, UA: NEGATIVE mg/dL
Hgb urine dipstick: NEGATIVE
KETONES UR: NEGATIVE mg/dL
NITRITE: NEGATIVE
PH: 7 (ref 5.0–8.0)
PROTEIN: 100 mg/dL — AB
Specific Gravity, Urine: 1.016 (ref 1.005–1.030)

## 2016-05-06 LAB — CBC
HCT: 35.5 % — ABNORMAL LOW (ref 36.0–46.0)
Hemoglobin: 12.4 g/dL (ref 12.0–15.0)
MCH: 26.4 pg (ref 26.0–34.0)
MCHC: 34.9 g/dL (ref 30.0–36.0)
MCV: 75.7 fL — ABNORMAL LOW (ref 78.0–100.0)
PLATELETS: 196 10*3/uL (ref 150–400)
RBC: 4.69 MIL/uL (ref 3.87–5.11)
RDW: 15.1 % (ref 11.5–15.5)
WBC: 9.5 10*3/uL (ref 4.0–10.5)

## 2016-05-06 MED ORDER — GI COCKTAIL ~~LOC~~
30.0000 mL | Freq: Once | ORAL | Status: AC
  Administered 2016-05-06: 30 mL via ORAL
  Filled 2016-05-06: qty 30

## 2016-05-06 NOTE — MAU Note (Signed)
Was asleep, woke up in pain.  Sharp, tight, constant.  2nd night in a row this has happened.  No bleeding or leaking. Was 2 cm at dr's office the other day, was given Betamethasone. Taking procardia, last taken 0730.

## 2016-05-06 NOTE — MAU Provider Note (Signed)
History     CSN: [id number]  Arrival date and time: 05/06/16 1424   First Provider Initiated Contact with Patient 05/06/16 1503      Chief Complaint  Patient presents with  . Abdominal Pain   G1 @34 .2 weeks here with generalized abdominal pain x24 hrs. She reports as sharp and achy and worse with palpation. She denies VB, LOF, and ctx. She reports good FM. She had one episode of vomiting yesterday. She denies sick contacts. She reports hx of heartburn and used TUMS without relief.    OB History    Gravida Para Term Preterm AB Living   1             SAB TAB Ectopic Multiple Live Births                  Past Medical History:  Diagnosis Date  . Anxiety attack   . Asthma   . Migraines   . Sickle-cell trait [facility])     Past Surgical History:  Procedure Laterality Date  . TONSILLECTOMY    . WISDOM TOOTH EXTRACTION      Family History  Problem Relation Age of Onset  . Bone cancer Mother     Social History  Substance Use Topics  . Smoking status: Never Smoker  . Smokeless tobacco: Never Used  . Alcohol use No     Comment: social    Allergies: No Known Allergies  Prescriptions Prior to Admission  Medication Sig Dispense Refill Last Dose  . butalbital-acetaminophen-caffeine (FIORICET, ESGIC) 50-325-40 MG tablet Take 1-2 tablets by mouth every 6 (six) hours as needed for headache. 30 tablet 1 Past Week at Unknown time  . cephALEXin (KEFLEX) 500 MG capsule Take 1 capsule (500 mg total) by mouth 4 (four) times daily. 20 capsule 0 05/06/2016 at Unknown time  . NIFEdipine (PROCARDIA) 10 MG capsule Take 1 capsule (10 mg total) by mouth 3 (three) times daily. 21 capsule 0 05/06/2016 at Unknown time  . Prenatal MV-Min-FA-Omega-3 (PRENATAL GUMMIES/DHA & FA) 0.4-32.5 MG CHEW Chew 2 each by mouth daily.   05/06/2016 at Unknown time  . albuterol (PROVENTIL HFA;VENTOLIN HFA) 108 (90 BASE) MCG/ACT inhaler Inhale 2 puffs into the lungs every 6 (six) hours as needed for wheezing or  shortness of breath.    rescue    Review of Systems  Constitutional: Negative.   Gastrointestinal: Positive for abdominal pain, heartburn and vomiting. Negative for constipation and diarrhea.  Genitourinary: Negative.    Physical Exam   Blood pressure 95/58, pulse 98, temperature 98 F (36.7 C), temperature source Oral, resp. rate 20.  Physical Exam  Constitutional: She is oriented to person, place, and time. She appears well-developed and well-nourished. No distress.  HENT:  Head: Normocephalic and atraumatic.  Neck: Normal range of motion. Neck supple.  Cardiovascular: Normal rate.   Respiratory: Effort normal.  GI: Soft. She exhibits no distension. There is tenderness (RLQ & LLQ-mild).  gravid  Genitourinary:  Genitourinary Comments: External: no lesions or erythema SVE: FT/thick  Musculoskeletal: Normal range of motion.  Neurological: She is alert and oriented to person, place, and time.  Skin: Skin is warm and dry.  Psychiatric: She has a normal mood and affect.   EFM: 135 bpm, mod variability, + accels, no decels Toco: rare  Results for orders placed or performed during the hospital encounter of 05/06/16 (from the past 24 hour(s))  Urinalysis, Routine w reflex microscopic     Status: Abnormal   Collection Time: 05/06/16  2:39 PM  Result Value Ref Range   Color, Urine YELLOW YELLOW   APPearance HAZY (A) CLEAR   Specific Gravity, Urine 1.016 1.005 - 1.030   pH 7.0 5.0 - 8.0   Glucose, UA NEGATIVE NEGATIVE mg/dL   Hgb urine dipstick NEGATIVE NEGATIVE   Bilirubin Urine NEGATIVE NEGATIVE   Ketones, ur NEGATIVE NEGATIVE mg/dL   Protein, ur 161100 (A) NEGATIVE mg/dL   Nitrite NEGATIVE NEGATIVE   Leukocytes, UA TRACE (A) NEGATIVE   RBC / HPF 0-5 0 - 5 RBC/hpf   WBC, UA 6-30 0 - 5 WBC/hpf   Bacteria, UA MANY (A) NONE SEEN   Squamous Epithelial / LPF 0-5 (A) NONE SEEN  CBC     Status: Abnormal   Collection Time: 05/06/16  3:24 PM  Result Value Ref Range   WBC 9.5 4.0 -  10.5 K/uL   RBC 4.69 3.87 - 5.11 MIL/uL   Hemoglobin 12.4 12.0 - 15.0 g/dL   HCT 09.635.5 (L) 04.536.0 - 40.946.0 %   MCV 75.7 (L) 78.0 - 100.0 fL   MCH 26.4 26.0 - 34.0 pg   MCHC 34.9 30.0 - 36.0 g/dL   RDW 81.115.1 91.411.5 - 78.215.5 %   Platelets 196 150 - 400 K/uL  Comprehensive metabolic panel     Status: Abnormal   Collection Time: 05/06/16  3:24 PM  Result Value Ref Range   Sodium 133 (L) 135 - 145 mmol/L   Potassium 3.4 (L) 3.5 - 5.1 mmol/L   Chloride 103 101 - 111 mmol/L   CO2 20 (L) 22 - 32 mmol/L   Glucose, Bld 102 (H) 65 - 99 mg/dL   BUN 7 6 - 20 mg/dL   Creatinine, Ser 9.560.66 0.44 - 1.00 mg/dL   Calcium 9.2 8.9 - 21.310.3 mg/dL   Total Protein 6.6 6.5 - 8.1 g/dL   Albumin 3.4 (L) 3.5 - 5.0 g/dL   AST 32 15 - 41 U/L   ALT 25 14 - 54 U/L   Alkaline Phosphatase 155 (H) 38 - 126 U/L   Total Bilirubin 0.8 0.3 - 1.2 mg/dL   GFR calc non Af Amer >60 >60 mL/min   GFR calc Af Amer >60 >60 mL/min   Anion gap 10 5 - 15    MAU Course  Procedures GI cocktail MDM Labs ordered and reviewed. Pt reports nausea but some improvement of upper abd pain. No evidence of PTL or acute abdominal process. Presentation, clinical findings, and plan discussed with Dr. Ellyn HackBovard. Stable for discharge home.  Assessment and Plan   1. [redacted] weeks gestation of pregnancy   2. Gastroesophageal reflux disease without esophagitis   3. Round ligament pain   4. NST (non-stress test) reactive    Discharge home Follow up as scheduled in office next week Start Zantac or Pepcid daily Maternity support belt  Allergies as of 05/06/2016   No Known Allergies     Medication List    TAKE these medications   albuterol 108 (90 Base) MCG/ACT inhaler Commonly known as:  PROVENTIL HFA;VENTOLIN HFA Inhale 2 puffs into the lungs every 6 (six) hours as needed for wheezing or shortness of breath.   butalbital-acetaminophen-caffeine 50-325-40 MG tablet Commonly known as:  FIORICET, ESGIC Take 1-2 tablets by mouth every 6 (six) hours as  needed for headache.   cephALEXin 500 MG capsule Commonly known as:  KEFLEX Take 1 capsule (500 mg total) by mouth 4 (four) times daily.   NIFEdipine 10 MG capsule Commonly known as:  PROCARDIA Take 1 capsule (10 mg total) by mouth 3 (three) times daily.   PRENATAL GUMMIES/DHA & FA 0.4-32.5 MG Chew Chew 2 each by mouth daily.      Donette LarryMelanie Charonda Hefter, CNM 05/06/2016, 3:04 PM

## 2016-05-06 NOTE — Discharge Instructions (Signed)
Round Ligament Pain Introduction The round ligament is a cord of muscle and tissue that helps to support the uterus. It can become a source of pain during pregnancy if it becomes stretched or twisted as the baby grows. The pain usually begins in the second trimester of pregnancy, and it can come and go until the baby is delivered. It is not a serious problem, and it does not cause harm to the baby. Round ligament pain is usually a short, sharp, and pinching pain, but it can also be a dull, lingering, and aching pain. The pain is felt in the lower side of the abdomen or in the groin. It usually starts deep in the groin and moves up to the outside of the hip area. Pain can occur with:  A sudden change in position.  Rolling over in bed.  Coughing or sneezing.  Physical activity. Follow these instructions at home: Watch your condition for any changes. Take these steps to help with your pain:  When the pain starts, relax. Then try:  Sitting down.  Flexing your knees up to your abdomen.  Lying on your side with one pillow under your abdomen and another pillow between your legs.  Sitting in a warm bath for 15-20 minutes or until the pain goes away.  Take over-the-counter and prescription medicines only as told by your health care provider.  Move slowly when you sit and stand.  Avoid long walks if they cause pain.  Stop or lessen your physical activities if they cause pain. Contact a health care provider if:  Your pain does not go away with treatment.  You feel pain in your back that you did not have before.  Your medicine is not helping. Get help right away if:  You develop a fever or chills.  You develop uterine contractions.  You develop vaginal bleeding.  You develop nausea or vomiting.  You develop diarrhea.  You have pain when you urinate. This information is not intended to replace advice given to you by your health care provider. Make sure you discuss any questions  you have with your health care provider. Document Released: 02/09/2008 Document Revised: 10/08/2015 Document Reviewed: 07/09/2014  2017 Elsevier Food Choices for Gastroesophageal Reflux Disease, Adult When you have gastroesophageal reflux disease (GERD), the foods you eat and your eating habits are very important. Choosing the right foods can help ease your discomfort. What guidelines do I need to follow?  Choose fruits, vegetables, whole grains, and low-fat dairy products.  Choose low-fat meat, fish, and poultry.  Limit fats such as oils, salad dressings, butter, nuts, and avocado.  Keep a food diary. This helps you identify foods that cause symptoms.  Avoid foods that cause symptoms. These may be different for everyone.  Eat small meals often instead of 3 large meals a day.  Eat your meals slowly, in a place where you are relaxed.  Limit fried foods.  Cook foods using methods other than frying.  Avoid drinking alcohol.  Avoid drinking large amounts of liquids with your meals.  Avoid bending over or lying down until 2-3 hours after eating. What foods are not recommended? These are some foods and drinks that may make your symptoms worse: Vegetables  Tomatoes. Tomato juice. Tomato and spaghetti sauce. Chili peppers. Onion and garlic. Horseradish. Fruits  Oranges, grapefruit, and lemon (fruit and juice). Meats  High-fat meats, fish, and poultry. This includes hot dogs, ribs, ham, sausage, salami, and bacon. Dairy  Whole milk and chocolate milk. Sour  cream. Cream. Butter. Ice cream. Cream cheese. Drinks  Coffee and tea. Bubbly (carbonated) drinks or energy drinks. Condiments  Hot sauce. Barbecue sauce. Sweets/Desserts  Chocolate and cocoa. Donuts. Peppermint and spearmint. Fats and Oils  High-fat foods. This includes [place] fries and potato chips. Other  Vinegar. Strong spices. This includes black pepper, white pepper, red pepper, cayenne, curry powder, cloves, ginger,  and chili powder. The items listed above may not be a complete list of foods and drinks to avoid. Contact your dietitian for more information.  This information is not intended to replace advice given to you by your health care provider. Make sure you discuss any questions you have with your health care provider. Document Released: 11/01/2011 Document Revised: 10/08/2015 Document Reviewed: 03/06/2013 Elsevier Interactive Patient Education  2017 [employer]

## 2016-05-07 LAB — CULTURE, OB URINE: Culture: NO GROWTH

## 2016-05-11 LAB — OB RESULTS CONSOLE GBS: GBS: NEGATIVE

## 2016-05-16 NOTE — L&D Delivery Note (Addendum)
Delivery Note Pt complete with no urge to push. Trial pushes initiated with no fetal descent. Pt allowed to labor down for 30-4445mins then pushing resumed for an hour. At 10:16 PM a viable female was delivered via Vaginal, Spontaneous Delivery (Presentation: MOA;  ).  APGAR:6, 9  , ; weight  7lbs 3oz.   Placenta status: spontaneously delivered, intact, shultz , .  Cord:3vc  with the following complications:none .  Cord pH: n/a  Anesthesia:  Epidural Episiotomy: None Lacerations: None Suture Repair: n/a Est. Blood Loss (mL): 200  Mom to postpartum.  Baby to Couplet care / Skin to Skin. Pt hopes to have circumcision done while in hospital- will make arrangements tomorrow  Kathleen Spears 06/08/2016, 10:50 PM

## 2016-06-03 ENCOUNTER — Telehealth (HOSPITAL_COMMUNITY): Payer: Self-pay | Admitting: *Deleted

## 2016-06-03 ENCOUNTER — Encounter (HOSPITAL_COMMUNITY): Payer: Self-pay | Admitting: *Deleted

## 2016-06-03 NOTE — Telephone Encounter (Signed)
Preadmission screen  

## 2016-06-06 NOTE — H&P (Addendum)
Kathleen Spears is a 21 y.o. female presenting for scheduled iol for term pregnancy. Her dating is based on an 65 week [place]. Pt has SS trait; partner is negative. Negative first trimester and essential panel screen. She complained of several benign pregnancy issues during prenatal care  - round ligament pain; sciatic nerve pain, tendonitis. Contractions and cervical dilation noted at [redacted] weeks gestation; she received BMZ and was started on procardia till [redacted] weeks gestation. Pt was also started on herpes prophylaxis with valtrex at [redacted]weeks gestation. She is GBS negative  OB History    Gravida Para Term Preterm AB Living   1             SAB TAB Ectopic Multiple Live Births                 Past Medical History:  Diagnosis Date  . Anxiety attack   . Asthma   . Herpes   . Migraines   . Sickle-cell trait [facility])    Past Surgical History:  Procedure Laterality Date  . TONSILLECTOMY    . WISDOM TOOTH EXTRACTION     Family History: family history includes Bone cancer in her mother. Social History:  reports that she has never smoked. She has never used smokeless tobacco. She reports that she does not drink alcohol or use drugs.     Maternal Diabetes: No Genetic Screening: Normal Maternal Ultrasounds/Referrals: Normal Fetal Ultrasounds or other Referrals:  None Maternal Substance Abuse:  No Significant Maternal Medications:  Meds include: Other:  valtrex Significant Maternal Lab Results:  Lab values include: Group B Strep negative Other Comments:  None  Review of Systems  Constitutional: Positive for malaise/fatigue. Negative for chills, fever and weight loss.  HENT: Negative for hearing loss.   Eyes: Negative for blurred vision.  Respiratory: Negative for shortness of breath.   Cardiovascular: Negative for chest pain and palpitations.  Gastrointestinal: Positive for abdominal pain. Negative for heartburn, nausea and vomiting.  Genitourinary: Negative for dysuria.  Musculoskeletal:  Positive for back pain and myalgias.  Skin: Negative for itching and rash.  Neurological: Negative for dizziness and headaches.  Endo/Heme/Allergies: Does not bruise/bleed easily.  Psychiatric/Behavioral: Negative for depression, hallucinations, substance abuse and suicidal ideas. The patient is nervous/anxious.    Maternal Medical History:  Reason for admission: Contractions.  Nausea.  Contractions: Onset was 1 week or more ago.   Frequency: irregular.   Perceived severity is mild.    Fetal activity: Perceived fetal activity is normal.    Prenatal complications: Preterm labor.   Prenatal Complications - Diabetes: none.      There were no vitals taken for this visit. Maternal Exam:  Uterine Assessment: Contraction strength is mild.  Contraction frequency is rare.   Abdomen: Patient reports generalized tenderness.  Estimated fetal weight is AGA.   Fetal presentation: vertex  Introitus: Normal vulva. Vulva is negative for lesion.  Normal vagina.  Vagina is negative for condylomata.  Pelvis: adequate for delivery.   Cervix: Cervix evaluated by digital exam.     Physical Exam  Constitutional: She is oriented to person, place, and time. She appears well-developed and well-nourished.  Neck: Normal range of motion.  Cardiovascular: Normal rate.   Respiratory: Effort normal.  GI: Soft. There is generalized tenderness.  Genitourinary: Vagina normal and uterus normal. Vulva exhibits no lesion.  Musculoskeletal: Normal range of motion. She exhibits no edema.  Neurological: She is alert and oriented to person, place, and time.  Skin: Skin is warm.  Psychiatric: She has a normal mood and affect. Her behavior is normal. Judgment and thought content normal.    Prenatal labs: ABO, Rh: O/Positive/-- (07/28 0000) Antibody: Negative (07/28 0000) Rubella: Immune (07/28 0000) RPR: Nonreactive (07/28 0000)  HBsAg: Negative (07/28 0000)  HIV: Non-reactive (07/28 0000)  GBS: Negative  (12/27 0000)   Assessment/Plan: G1P0 at 39 0/[redacted]wks gestation for iol - stable GBS neg AROM/pit for iol Pain control prn  [name] 06/06/2016, 5:09 PM

## 2016-06-08 ENCOUNTER — Inpatient Hospital Stay (HOSPITAL_COMMUNITY)
Admission: RE | Admit: 2016-06-08 | Discharge: 2016-06-10 | DRG: 774 | Disposition: A | Discharge status: Home / Self Care | Payer: Medicaid Other | Source: Ambulatory Visit | Attending: Obstetrics and Gynecology | Admitting: Obstetrics and Gynecology

## 2016-06-08 ENCOUNTER — Inpatient Hospital Stay (HOSPITAL_COMMUNITY): Payer: Medicaid Other | Admitting: Anesthesiology

## 2016-06-08 ENCOUNTER — Encounter (HOSPITAL_COMMUNITY): Payer: Self-pay

## 2016-06-08 DIAGNOSIS — O9832 Other infections with a predominantly sexual mode of transmission complicating childbirth: Principal | ICD-10-CM | POA: Diagnosis present

## 2016-06-08 DIAGNOSIS — Z3A39 39 weeks gestation of pregnancy: Secondary | ICD-10-CM | POA: Diagnosis not present

## 2016-06-08 DIAGNOSIS — O9902 Anemia complicating childbirth: Secondary | ICD-10-CM | POA: Diagnosis present

## 2016-06-08 DIAGNOSIS — A6000 Herpesviral infection of urogenital system, unspecified: Secondary | ICD-10-CM | POA: Diagnosis present

## 2016-06-08 DIAGNOSIS — Z3490 Encounter for supervision of normal pregnancy, unspecified, unspecified trimester: Secondary | ICD-10-CM

## 2016-06-08 DIAGNOSIS — Z3493 Encounter for supervision of normal pregnancy, unspecified, third trimester: Secondary | ICD-10-CM | POA: Diagnosis present

## 2016-06-08 DIAGNOSIS — D573 Sickle-cell trait: Secondary | ICD-10-CM | POA: Diagnosis present

## 2016-06-08 LAB — CBC
HEMATOCRIT: 37 % (ref 36.0–46.0)
HEMOGLOBIN: 12.8 g/dL (ref 12.0–15.0)
MCH: 25.7 pg — AB (ref 26.0–34.0)
MCHC: 34.6 g/dL (ref 30.0–36.0)
MCV: 74.3 fL — AB (ref 78.0–100.0)
Platelets: 147 10*3/uL — ABNORMAL LOW (ref 150–400)
RBC: 4.98 MIL/uL (ref 3.87–5.11)
RDW: 18.1 % — ABNORMAL HIGH (ref 11.5–15.5)
WBC: 5.6 10*3/uL (ref 4.0–10.5)

## 2016-06-08 LAB — RPR: RPR: NONREACTIVE

## 2016-06-08 LAB — ABO/RH: ABO/RH(D): O POS

## 2016-06-08 LAB — TYPE AND SCREEN
ABO/RH(D): O POS
Antibody Screen: NEGATIVE

## 2016-06-08 MED ORDER — PHENYLEPHRINE 40 MCG/ML (10ML) SYRINGE FOR IV PUSH (FOR BLOOD PRESSURE SUPPORT)
80.0000 ug | PREFILLED_SYRINGE | INTRAVENOUS | Status: DC | PRN
  Filled 2016-06-08: qty 5

## 2016-06-08 MED ORDER — OXYTOCIN BOLUS FROM INFUSION
500.0000 mL | Freq: Once | INTRAVENOUS | Status: AC
  Administered 2016-06-08: 500 mL via INTRAVENOUS

## 2016-06-08 MED ORDER — LIDOCAINE HCL (PF) 1 % IJ SOLN
INTRAMUSCULAR | Status: DC | PRN
  Administered 2016-06-08: 4 mL via EPIDURAL

## 2016-06-08 MED ORDER — LACTATED RINGERS IV SOLN
INTRAVENOUS | Status: DC
  Administered 2016-06-08 (×2): via INTRAVENOUS

## 2016-06-08 MED ORDER — EPHEDRINE 5 MG/ML INJ
10.0000 mg | INTRAVENOUS | Status: DC | PRN
  Filled 2016-06-08: qty 4

## 2016-06-08 MED ORDER — LACTATED RINGERS IV SOLN: 500.0000 mL | Freq: Once | INTRAVENOUS | Status: DC

## 2016-06-08 MED ORDER — OXYTOCIN 40 UNITS IN LACTATED RINGERS INFUSION - SIMPLE MED
1.0000 m[IU]/min | INTRAVENOUS | Status: DC
  Administered 2016-06-08: 2 m[IU]/min via INTRAVENOUS
  Filled 2016-06-08: qty 1000

## 2016-06-08 MED ORDER — SOD CITRATE-CITRIC ACID 500-334 MG/5ML PO SOLN: 30.0000 mL | ORAL | Status: DC | PRN

## 2016-06-08 MED ORDER — TERBUTALINE SULFATE 1 MG/ML IJ SOLN
0.2500 mg | Freq: Once | INTRAMUSCULAR | Status: DC | PRN
  Filled 2016-06-08: qty 1

## 2016-06-08 MED ORDER — FENTANYL 2.5 MCG/ML BUPIVACAINE 1/10 % EPIDURAL INFUSION (WH - ANES)
14.0000 mL/h | INTRAMUSCULAR | Status: DC | PRN
  Administered 2016-06-08 (×3): 14 mL/h via EPIDURAL
  Filled 2016-06-08 (×3): qty 100

## 2016-06-08 MED ORDER — PHENYLEPHRINE 40 MCG/ML (10ML) SYRINGE FOR IV PUSH (FOR BLOOD PRESSURE SUPPORT)
80.0000 ug | PREFILLED_SYRINGE | INTRAVENOUS | Status: DC | PRN
  Filled 2016-06-08: qty 5
  Filled 2016-06-08: qty 10

## 2016-06-08 MED ORDER — DIPHENHYDRAMINE HCL 50 MG/ML IJ SOLN: 12.5000 mg | INTRAMUSCULAR | Status: DC | PRN

## 2016-06-08 MED ORDER — ONDANSETRON HCL 4 MG/2ML IJ SOLN: 4.0000 mg | Freq: Four times a day (QID) | INTRAMUSCULAR | Status: DC | PRN

## 2016-06-08 MED ORDER — ACETAMINOPHEN 325 MG PO TABS: 650.0000 mg | ORAL_TABLET | ORAL | Status: DC | PRN

## 2016-06-08 MED ORDER — FENTANYL CITRATE (PF) 100 MCG/2ML IJ SOLN: 50.0000 ug | INTRAMUSCULAR | Status: DC | PRN

## 2016-06-08 MED ORDER — LIDOCAINE HCL (PF) 1 % IJ SOLN
30.0000 mL | INTRAMUSCULAR | Status: DC | PRN
  Filled 2016-06-08: qty 30

## 2016-06-08 MED ORDER — OXYTOCIN 40 UNITS IN LACTATED RINGERS INFUSION - SIMPLE MED: 2.5000 [IU]/h | INTRAVENOUS | Status: DC

## 2016-06-08 MED ORDER — LACTATED RINGERS IV SOLN: 500.0000 mL | INTRAVENOUS | Status: DC | PRN

## 2016-06-08 NOTE — Anesthesia Preprocedure Evaluation (Signed)
Anesthesia Evaluation  Patient identified by MRN, date of birth, ID band Patient awake    Reviewed: Allergy & Precautions, NPO status , Patient's Chart, lab work & pertinent test results  Airway Mallampati: I  TM Distance: >3 FB Neck ROM: Full    Dental  (+) Teeth Intact   Pulmonary asthma ,    breath sounds clear to auscultation       Cardiovascular negative cardio ROS   Rhythm:Regular Rate:Normal     Neuro/Psych  Headaches, PSYCHIATRIC DISORDERS Anxiety    GI/Hepatic negative GI ROS, Neg liver ROS,   Endo/Other  negative endocrine ROS  Renal/GU negative Renal ROS  negative genitourinary   Musculoskeletal negative musculoskeletal ROS (+)   Abdominal   Peds negative pediatric ROS (+)  Hematology negative hematology ROS (+)   Anesthesia Other Findings   Reproductive/Obstetrics (+) Pregnancy                             Lab Results  Component Value Date   WBC 5.6 06/08/2016   HGB 12.8 06/08/2016   HCT 37.0 06/08/2016   MCV 74.3 (L) 06/08/2016   PLT 147 (L) 06/08/2016   No results found for: INR, PROTIME   Anesthesia Physical Anesthesia Plan  ASA: II  Anesthesia Plan: Epidural   Post-op Pain Management:    Induction:   Airway Management Planned:   Additional Equipment:   Intra-op Plan:   Post-operative Plan:   Informed Consent: I have reviewed the patients History and Physical, chart, labs and discussed the procedure including the risks, benefits and alternatives for the proposed anesthesia with the patient or authorized representative who has indicated his/her understanding and acceptance.     Plan Discussed with:   Anesthesia Plan Comments:         Anesthesia Quick Evaluation

## 2016-06-08 NOTE — Progress Notes (Signed)
Patient ID: Kathleen CrockerJasmine M Horwitz, female   DOB: 1996/03/02, 21 y.o.   MRN: [id number] Pt doing well. Tolerating pitocin/contractions with no complaints VSS EFM - cat 1, 150s TOCO - contractions q 1-1037mins SVE - 4/75/-2  A/P: Prime at [redacted] weeks gestation for elective iol - doing well on pit         AROM performed with moderate return of clear fluid         Pain control prn         Anticipate svd

## 2016-06-08 NOTE — Progress Notes (Signed)
Patient ID: Kathleen CrockerJasmine M Spears, female   DOB: [birth year], 21 y.o.   MRN: [id number] Pt comfortable with epidural. No complaints. +Fms VSS CAT 1 Contractions q 1-335mins SVE - unchanged  A/P: Prime at term - on pitocin         Continue pit per protocol         Anticipate svd

## 2016-06-08 NOTE — Anesthesia Procedure Notes (Signed)
Epidural Patient location during procedure: OB Start time: 06/08/2016 9:46 AM End time: 06/08/2016 9:52 AM  Staffing Anesthesiologist: Shona SimpsonHOLLIS, Foy Mungia D Performed: anesthesiologist   Preanesthetic Checklist Completed: patient identified, site marked, surgical consent, pre-op evaluation, timeout performed, IV checked, risks and benefits discussed and monitors and equipment checked  Epidural Patient position: sitting Prep: ChloraPrep Patient monitoring: heart rate, continuous pulse ox and blood pressure Approach: midline Location: L3-L4 Injection technique: LOR saline  Needle:  Needle type: Tuohy  Needle gauge: 17 G Needle length: 9 cm Catheter type: closed end flexible Catheter size: 20 Guage Test dose: negative and 1.5% lidocaine  Assessment Events: blood not aspirated, injection not painful, no injection resistance and no paresthesia  Additional Notes LOR @ 5  Patient identified. Risks/Benefits/Options discussed with patient including but not limited to bleeding, infection, nerve damage, paralysis, failed block, incomplete pain control, headache, blood pressure changes, nausea, vomiting, reactions to medications, itching and postpartum back pain. Confirmed with bedside nurse the patient's most recent platelet count. Confirmed with patient that they are not currently taking any anticoagulation, have any bleeding history or any family history of bleeding disorders. Patient expressed understanding and wished to proceed. All questions were answered. Sterile technique was used throughout the entire procedure. Please see nursing notes for vital signs. Test dose was given through epidural catheter and negative prior to continuing to dose epidural or start infusion. Warning signs of high block given to the patient including shortness of breath, tingling/numbness in hands, complete motor block, or any concerning symptoms with instructions to call for help. Patient was given instructions on fall  risk and not to get out of bed. All questions and concerns addressed with instructions to call with any issues or inadequate analgesia.    Reason for block:procedure for pain

## 2016-06-08 NOTE — Progress Notes (Signed)
Patient ID: Kathleen CrockerJasmine M Seto, female   DOB: April 02, 1996, 21 y.o.   MRN: [id number] Pt awake and alert. Feels better; occasional nausea. No complaints VSS EFM- 120, cat 1 TOCO - ctxs q 1-802mins  SVE - 8/100/0  A/P: Prime at term, progressing in labor - stable         Peanut ball now         Anticipate svd

## 2016-06-08 NOTE — Anesthesia Pain Management Evaluation Note (Signed)
  CRNA Pain Management Visit Note  Patient: Kathleen Spears, 21 y.o., female  "Hello I am a member of the anesthesia team at [facility]. We have an anesthesia team available at all times to provide care throughout the hospital, including epidural management and anesthesia for C-section. I don't know your plan for the delivery whether it a natural birth, water birth, IV sedation, nitrous supplementation, doula or epidural, but we want to meet your pain goals."   1.Was your pain managed to your expectations on prior hospitalizations?   No prior hospitalizations  2.What is your expectation for pain management during this hospitalization?     Epidural  3.How can we help you reach that goal? epidural  Record the patient's initial score and the patient's pain goal.   Pain: 1  Pain Goal: 5 [facility] wants you to be able to say your pain was always managed very well.  Breanne Olvera 06/08/2016

## 2016-06-08 NOTE — Progress Notes (Signed)
Patient ID: Kathleen CrockerJasmine M Spears, female   DOB: 11-20-1995, 21 y.o.   MRN: [id number] Pt complains of fatigue, numbness and dizziness and difficulty closing her eye. Also reports blurry vision in right eye.  Per anesthesia no concerns VSS EFM - cat 1, 110 TOCO - IUPC placed ctxs q 1-323mins SVE - 6/90/-1  A/P: Prime progressing well on pitocin         Able to close eyes on my exam; numbness she c/o of in legs is due to epidural; reassured family         Will give 250ml ivf bolus          Continue to monitor

## 2016-06-09 LAB — CBC
HCT: 34.4 % — ABNORMAL LOW (ref 36.0–46.0)
HEMATOCRIT: 36.5 % (ref 36.0–46.0)
HEMOGLOBIN: 11.9 g/dL — AB (ref 12.0–15.0)
Hemoglobin: 12.8 g/dL (ref 12.0–15.0)
MCH: 25.8 pg — AB (ref 26.0–34.0)
MCH: 26.3 pg (ref 26.0–34.0)
MCHC: 34.6 g/dL (ref 30.0–36.0)
MCHC: 35.1 g/dL (ref 30.0–36.0)
MCV: 74.5 fL — ABNORMAL LOW (ref 78.0–100.0)
MCV: 75.1 fL — AB (ref 78.0–100.0)
PLATELETS: 155 10*3/uL (ref 150–400)
Platelets: 142 10*3/uL — ABNORMAL LOW (ref 150–400)
RBC: 4.62 MIL/uL (ref 3.87–5.11)
RBC: 4.86 MIL/uL (ref 3.87–5.11)
RDW: 17.6 % — ABNORMAL HIGH (ref 11.5–15.5)
RDW: 17.6 % — AB (ref 11.5–15.5)
WBC: 19.6 10*3/uL — ABNORMAL HIGH (ref 4.0–10.5)
WBC: 22.7 10*3/uL — ABNORMAL HIGH (ref 4.0–10.5)

## 2016-06-09 MED ORDER — SIMETHICONE 80 MG PO CHEW: 80.0000 mg | CHEWABLE_TABLET | ORAL | Status: DC | PRN

## 2016-06-09 MED ORDER — ACETAMINOPHEN 325 MG PO TABS: 650.0000 mg | ORAL_TABLET | ORAL | Status: DC | PRN

## 2016-06-09 MED ORDER — COCONUT OIL OIL: 1.0000 "application " | TOPICAL_OIL | Status: DC | PRN

## 2016-06-09 MED ORDER — BENZOCAINE-MENTHOL 20-0.5 % EX AERO: 1.0000 "application " | INHALATION_SPRAY | CUTANEOUS | Status: DC | PRN

## 2016-06-09 MED ORDER — SENNOSIDES-DOCUSATE SODIUM 8.6-50 MG PO TABS
2.0000 | ORAL_TABLET | ORAL | Status: DC
  Administered 2016-06-09: 2 via ORAL
  Filled 2016-06-09: qty 2

## 2016-06-09 MED ORDER — ONDANSETRON HCL 4 MG/2ML IJ SOLN: 4.0000 mg | INTRAMUSCULAR | Status: DC | PRN

## 2016-06-09 MED ORDER — DIBUCAINE 1 % RE OINT: 1.0000 "application " | TOPICAL_OINTMENT | RECTAL | Status: DC | PRN

## 2016-06-09 MED ORDER — HYDROCORTISONE ACE-PRAMOXINE 1-1 % RE FOAM
1.0000 | Freq: Two times a day (BID) | RECTAL | Status: DC
  Administered 2016-06-09 (×2): 1 via RECTAL
  Filled 2016-06-09: qty 10

## 2016-06-09 MED ORDER — TETANUS-DIPHTH-ACELL PERTUSSIS 5-2.5-18.5 LF-MCG/0.5 IM SUSP: 0.5000 mL | Freq: Once | INTRAMUSCULAR | Status: DC

## 2016-06-09 MED ORDER — WITCH HAZEL-GLYCERIN EX PADS
1.0000 "application " | MEDICATED_PAD | CUTANEOUS | Status: DC | PRN
  Administered 2016-06-09: 1 via TOPICAL

## 2016-06-09 MED ORDER — PRENATAL MULTIVITAMIN CH
1.0000 | ORAL_TABLET | Freq: Every day | ORAL | Status: DC
  Administered 2016-06-09: 1 via ORAL
  Filled 2016-06-09: qty 1

## 2016-06-09 MED ORDER — ONDANSETRON HCL 4 MG PO TABS: 4.0000 mg | ORAL_TABLET | ORAL | Status: DC | PRN

## 2016-06-09 MED ORDER — IBUPROFEN 600 MG PO TABS
600.0000 mg | ORAL_TABLET | Freq: Four times a day (QID) | ORAL | Status: DC
  Administered 2016-06-09 – 2016-06-10 (×4): 600 mg via ORAL
  Filled 2016-06-09 (×5): qty 1

## 2016-06-09 MED ORDER — ZOLPIDEM TARTRATE 5 MG PO TABS: 5.0000 mg | ORAL_TABLET | Freq: Every evening | ORAL | Status: DC | PRN

## 2016-06-09 MED ORDER — DIPHENHYDRAMINE HCL 25 MG PO CAPS: 25.0000 mg | ORAL_CAPSULE | Freq: Four times a day (QID) | ORAL | Status: DC | PRN

## 2016-06-09 NOTE — Anesthesia Postprocedure Evaluation (Signed)
Anesthesia Post Note  Patient: Kathleen Spears  Procedure(s) Performed: * No procedures listed *  Patient location during evaluation: Mother Baby Anesthesia Type: Epidural Level of consciousness: awake Pain management: satisfactory to patient Vital Signs Assessment: post-procedure vital signs reviewed and stable Respiratory status: spontaneous breathing Cardiovascular status: stable Anesthetic complications: no        Last Vitals:  Vitals:   06/09/16 0200 06/09/16 0535  BP: 122/65 117/76  Pulse: 86 98  Resp: 20 16  Temp: 36.8 C 36.8 C    Last Pain:  Vitals:   06/09/16 0535  TempSrc: Oral  PainSc: 0-No pain   Pain Goal: Patients Stated Pain Goal: 3 (06/08/16 0945)               Cephus ShellingBURGER,Lillyahna Hemberger

## 2016-06-09 NOTE — Progress Notes (Signed)
Post Partum Day 1 Subjective: no complaints, up ad lib, voiding, tolerating PO and nl lochia, pain controlled  Objective: Blood pressure 117/76, pulse 98, temperature 98.2 F (36.8 C), temperature source Oral, resp. rate 16, height 5' (1.524 m), weight 63.5 kg (140 lb), SpO2 99 %, unknown if currently breastfeeding.  Physical Exam:  General: alert and no distress Lochia: appropriate Uterine Fundus: firm   Recent Labs  06/08/16 2339 06/09/16 0529  HGB 12.8 11.9*  HCT 36.5 34.4*    Assessment/Plan: Plan for discharge tomorrow, Breastfeeding and Lactation consult.  Routine PP care.     LOS: 1 day   Kathleen Spears, Kathleen Spears 06/09/2016, 8:32 AM

## 2016-06-09 NOTE — Lactation Note (Signed)
This note was copied from a baby's chart. Lactation Consultation Note: Initial visit with mom. Baby now 3716 hours old. Mom reports baby had a couple of good feedings after birth but has been sleepy today. Unwrapped and undressed baby. Placed in football hold. Mom able to hand express slight glistening of Colostrum. Baby too sleepy and would not latch. Left skin to skin with mom. Asking about manual pump. Given- reviewed setup, use and cleaning of pump pieces. Reviewed feeding cues and encouraged to feed whenever she sees them. Pacifier in bassinet. Encouraged not to use it yet- to have baby at the breast. BF brochure given to mom. Reviewed our phone number, OP appointments and BFSG as resources for support after DC. To call for assist pen.   Patient Name: Kathleen Eunice BlaseJasmine Bridgett ZOXWR'UToday's Date: 06/09/2016 Reason for consult: Initial assessment   Maternal Data Formula Feeding for Exclusion: No Has patient been taught Hand Expression?: Yes Does the patient have breastfeeding experience prior to this delivery?: No  Feeding Feeding Type: Breast Fed Length of feed: 0 min  LATCH Score/Interventions Latch: Too sleepy or reluctant, no latch achieved, no sucking elicited.  Audible Swallowing: None  Type of Nipple: Everted at rest and after stimulation  Comfort (Breast/Nipple): Soft / non-tender     Hold (Positioning): Assistance needed to correctly position infant at breast and maintain latch. Intervention(s): Breastfeeding basics reviewed  LATCH Score: 5  Lactation Tools Discussed/Used Pump Review: Setup, frequency, and cleaning Initiated by:: DW Date initiated:: 06/09/16   Consult Status Consult Status: Follow-up Date: 06/10/16 Follow-up type: In-patient    Pamelia HoitWeeks, Walden Statz D 06/09/2016, 2:24 PM

## 2016-06-09 NOTE — Progress Notes (Signed)
UR chart review completed.  

## 2016-06-09 NOTE — Progress Notes (Signed)
CSW acknowledges consult.  When CSW arrived, MOB was in the shower.  CSW will attempt to meet with MOB at a later time.  Blaine HamperAngel Boyd-Gilyard, MSW, LCSW Clinical Social Work [phone]

## 2016-06-10 MED ORDER — IBUPROFEN 600 MG PO TABS: 600.0000 mg | ORAL_TABLET | Freq: Four times a day (QID) | ORAL | 0 refills | Status: DC

## 2016-06-10 NOTE — Discharge Summary (Signed)
OB Discharge Summary     Patient Name: Kathleen Spears DOB: August 03, 1995 MRN: [id number]  Date of admission: 06/08/2016 Delivering MD: Pryor OchoaBANGA, CECILIA [facility]   Date of discharge: 06/10/2016  Admitting diagnosis: 39 wk induction Intrauterine pregnancy: 6482w0d     Secondary diagnosis:  Active Problems:   Term pregnancy   SVD (spontaneous vaginal delivery)   Postpartum care following vaginal delivery  Additional problems: none     Discharge diagnosis: Term Pregnancy Delivered                                                                                                Post partum procedures:none  Augmentation: AROM and Pitocin  Complications: None  Hospital course:  Induction of Labor With Vaginal Delivery   21 y.o. yo G1P1001 at 3082w0d was admitted to the hospital 06/08/2016 for induction of labor.  Indication for induction: Favorable cervix at term.  Patient had an uncomplicated labor course as follows: Membrane Rupture Time/Date: 8:58 AM ,06/08/2016   Intrapartum Procedures: Episiotomy: None [1]                                         Lacerations:  None [1]  Patient had delivery of a Viable infant.  Information for the patient's newborn:  Isidore MoosLucas, Boy Agness [[id number]][[id number]]      06/08/2016  Details of delivery can be found in separate delivery note.  Patient had a routine postpartum course. Patient is discharged home 06/10/16.  Physical exam  Vitals:   06/09/16 0535 06/09/16 1216 06/09/16 1800 06/10/16 0620  BP: 117/76 129/64 124/65 112/66  Pulse: 98 64 82 73  Resp: 16 17 18 18   Temp: 98.2 F (36.8 C) 98 F (36.7 C) 98.4 F (36.9 C) 98.3 F (36.8 C)  TempSrc: Oral Oral Oral Oral  SpO2: 99%     Weight:      Height:       General: alert and cooperative Lochia: appropriate Uterine Fundus: firm  Labs: Lab Results  Component Value Date   WBC 22.7 (H) 06/09/2016   HGB 11.9 (L) 06/09/2016   HCT 34.4 (L) 06/09/2016   MCV 74.5 (L) 06/09/2016   PLT 155 06/09/2016    CMP Latest Ref Rng & Units 05/06/2016  Glucose 65 - 99 mg/dL 284(X102(H)  BUN 6 - 20 mg/dL 7  Creatinine 3.240.44 - 4.011.00 mg/dL 0.270.66  Sodium 253135 - 664145 mmol/L 133(L)  Potassium 3.5 - 5.1 mmol/L 3.4(L)  Chloride 101 - 111 mmol/L 103  CO2 22 - 32 mmol/L 20(L)  Calcium 8.9 - 10.3 mg/dL 9.2  Total Protein 6.5 - 8.1 g/dL 6.6  Total Bilirubin 0.3 - 1.2 mg/dL 0.8  Alkaline Phos 38 - 126 U/L 155(H)  AST 15 - 41 U/L 32  ALT 14 - 54 U/L 25    Discharge instruction: per After Visit Summary and "Baby and Me Booklet".  After visit meds:  Allergies as of 06/10/2016      Reactions   Hydrocodone Nausea And Vomiting  Medication List    STOP taking these medications   butalbital-acetaminophen-caffeine 50-325-40 MG tablet Commonly known as:  FIORICET, ESGIC   cephALEXin 500 MG capsule Commonly known as:  KEFLEX   NIFEdipine 10 MG capsule Commonly known as:  PROCARDIA     TAKE these medications   ibuprofen 600 MG tablet Commonly known as:  ADVIL,MOTRIN Take 1 tablet (600 mg total) by mouth every 6 (six) hours.   PRENATAL GUMMIES/DHA & FA 0.4-32.5 MG Chew Chew 2 each by mouth daily.       Diet: routine diet  Activity: Advance as tolerated. Pelvic rest for 6 weeks.   Outpatient follow up:6 weeks Follow up Appt:No future appointments. Follow up Visit:No Follow-up on file.  Postpartum contraception: Depo Provera  Newborn Data: Live born female  Birth Weight: 7 lb 3.9 oz (3286 g) APGAR: 6, 9  Baby Feeding: Breast Disposition:home with mother   06/10/2016 Oliver Pila, MD

## 2016-06-10 NOTE — Progress Notes (Signed)
Post Partum Day 2 Subjective: up ad lib and tolerating PO.  Some right hand tingling c/w carpal tunnel  Objective: Blood pressure 112/66, pulse 73, temperature 98.3 F (36.8 C), temperature source Oral, resp. rate 18, height 5' (1.524 m), weight 63.5 kg (140 lb), SpO2 99 %, unknown if currently breastfeeding.  Physical Exam:  General: alert and cooperative Lochia: appropriate Uterine Fundus: firm    Recent Labs  06/08/16 2339 06/09/16 0529  HGB 12.8 11.9*  HCT 36.5 34.4*    Assessment/Plan: Discharge home  Plans circ in office   LOS: 2 days   Edith Lord W 06/10/2016, 8:50 AM

## 2016-06-10 NOTE — Lactation Note (Signed)
This note was copied from a baby's chart. Lactation Consultation Note  Patient Name: Kathleen Eunice BlaseJasmine Genter RUEAV'WToday's Date: 06/10/2016   Visited with Mom today on day of discharge, baby 9036 hrs old.  Mom has decided to formula feed.  RN at bedside, and will discuss breast engorgement treatment.  Mom denies any questions for LC.  Encouraged Mom to call Lactation with any questions or concerns after discharge.      [name], [name] 06/10/2016, 10:26 AM

## 2016-06-10 NOTE — Clinical Social Work Maternal (Signed)
  CLINICAL SOCIAL WORK MATERNAL/CHILD NOTE  Patient Details  Name: Kathleen Spears MRN: [id number] Date of Birth: 01/12/1996  Date:  06/10/2016  Clinical Social Worker Initiating Note:  Laurey Arrow Date/ Time Initiated:  06/10/16/1123     Child's Name:  Lesle Reek   Legal Guardian:   (FOB is Therapist, art)   Need for Interpreter:  None   Date of Referral:  06/10/16     Reason for Referral:  Behavioral Health Issues, including SI  (Hx of anxiety and  depression)   Referral Source:  [facility]   Address:  [address] [place] [address]  Phone number:  [phone]   Household Members:  Self, Siblings, Parents (MOB resides with MOB's mother and Stepfather.)   Natural Supports (not living in the home):  Spouse/significant other   Professional Supports: None   Employment: Animator   Type of Work: Print production planner at [employer]:  Chiropractor Resources:  [employer]   Other Resources:  [employer], Physicist, medical    Cultural/Religious Considerations Which May Impact Care:  Per [employer] Sheet, MOB is Non-Denominational.  Strengths:  Ability to meet basic needs , Pediatrician chosen , Home prepared for child    Risk Factors/Current Problems:  Mental Health Concerns    Cognitive State:  Able to Concentrate , Alert    Mood/Affect:  Bright , Happy , Interested , Comfortable    CSW Assessment: CSW met with MOB to complete an assessment for hx of anxiety/depression.  When CSW arrived, MOB was in the bathroom preparing to be dc, and FOB was sitting on the couch bonding with infant.  MOB gave CSW permission to meet with MOB while FOB was present.  MOB and FOB were polite and appeared excited to be first time parents. MOB communicated that MOB was eager to d/c in order to rest and continue attachment and bonding with infant.  CSW inquired about MOB MH hx and MOB acknowledged a hx of anxiety an depression.  MOB reported that  MOB was dx around age 54 and received counseling until about 2 years ago.  MOB denied medication management and shared that MOB is an established patient with [facility].CSW educated MOB about PPD. CSW informed MOB of possible supports and interventions to decrease PPD.  CSW also encouraged MOB to seek medical attention if needed for increased signs and symptoms for PPD. CSW offered MOB other community resources for parenting and MH and MOB declined. MOB assured CSW that MOB feels comfortable seeking help if the need arise.  CSW reviewed safe sleep, and SIDS. MOB was knowledgeable and stated that MOB had to take a SIDS course for MOB's current employer.  MOB communicated that she has a crib for the baby, and feels prepared for the infant.  MOB did not have any further questions, concerns, or needs at this time  CSW Plan/Description:  Patient/Family Education , Information/Referral to [employer] , No Further Intervention Required/No Barriers to Discharge   Laurey Arrow, MSW, LCSW Clinical Social Work [phone]    Dimple Nanas, LCSW 06/10/2016, 11:26 AM

## 2016-07-08 ENCOUNTER — Ambulatory Visit (HOSPITAL_COMMUNITY)
Admission: EM | Admit: 2016-07-08 | Discharge: 2016-07-08 | Disposition: A | Discharge status: Home / Self Care | Payer: BLUE CROSS/BLUE SHIELD | Attending: Family Medicine | Admitting: Family Medicine

## 2016-07-08 ENCOUNTER — Encounter (HOSPITAL_COMMUNITY): Payer: Self-pay | Admitting: Emergency Medicine

## 2016-07-08 DIAGNOSIS — L700 Acne vulgaris: Secondary | ICD-10-CM | POA: Diagnosis not present

## 2016-07-08 MED ORDER — CLINDAMYCIN PHOS-BENZOYL PEROX 1-5 % EX GEL: Freq: Two times a day (BID) | CUTANEOUS | 1 refills | Status: DC

## 2016-07-08 MED ORDER — MINOCYCLINE HCL 100 MG PO CAPS: 100.0000 mg | ORAL_CAPSULE | Freq: Two times a day (BID) | ORAL | 0 refills | Status: DC

## 2016-07-08 NOTE — ED Triage Notes (Addendum)
Pt was told she was positive for herpes when she found out she was pregnant 7 months ago.  Pt was taking Valtrex 1,000 mg BID.  Her last dose was last week.  Pt woke up this morning with oral swelling, lower lip and facial burning.

## 2016-07-08 NOTE — ED Provider Notes (Signed)
[facility]    CSN: [id number] Arrival date & time: 07/08/16  1422     History   Chief Complaint Chief Complaint  Patient presents with  . facial burning    HPI Kathleen Spears is a 21 y.o. female.   The history is provided by the patient.  Rash  Location:  Face Facial rash location:  Face Quality: painful and redness   Pain details:    Quality:  Aching, throbbing and sharp   Severity:  Moderate   Onset quality:  Gradual   Duration:  1 week   Progression:  Unchanged Severity:  Moderate Onset quality:  Gradual Progression:  Unchanged Chronicity:  Chronic Relieved by:  None tried Worsened by:  Nothing Ineffective treatments:  None tried Associated symptoms: no fever     Past Medical History:  Diagnosis Date  . Anxiety attack   . Asthma   . Herpes   . Migraines   . Sickle-cell trait [facility])     Patient Active Problem List   Diagnosis Date Noted  . Postpartum care following vaginal delivery 06/09/2016  . Term pregnancy 06/08/2016  . SVD (spontaneous vaginal delivery) 06/08/2016  . Migraine without aura and without status migrainosus, not intractable 05/02/2014  . Migraines     Past Surgical History:  Procedure Laterality Date  . TONSILLECTOMY    . WISDOM TOOTH EXTRACTION      OB History    Gravida Para Term Preterm AB Living   1 1 1     1    SAB TAB Ectopic Multiple Live Births         0 1       Home Medications    Prior to Admission medications   Medication Sig Start Date End Date Taking? Authorizing Provider  Prenatal MV-Min-FA-Omega-3 (PRENATAL GUMMIES/DHA & FA) 0.4-32.5 MG CHEW Chew 2 each by mouth daily.   Yes Historical Provider, MD  clindamycin-benzoyl peroxide (BENZACLIN) gel Apply topically 2 (two) times daily. 07/08/16   Linna Hoff, MD  ibuprofen (ADVIL,MOTRIN) 600 MG tablet Take 1 tablet (600 mg total) by mouth every 6 (six) hours. 06/10/16   Huel Cote, MD  minocycline (MINOCIN,DYNACIN) 100 MG capsule Take 1  capsule (100 mg total) by mouth 2 (two) times daily. 07/08/16   Linna Hoff, MD    Family History Family History  Problem Relation Age of Onset  . Bone cancer Mother     Social History Social History  Substance Use Topics  . Smoking status: Never Smoker  . Smokeless tobacco: Never Used  . Alcohol use No     Comment: social     Allergies   Hydrocodone   Review of Systems Review of Systems  Constitutional: Negative for fever.  Skin: Positive for rash.  All other systems reviewed and are negative.    Physical Exam Triage Vital Signs ED Triage Vitals [07/08/16 1447]  Enc Vitals Group     BP 118/77     Pulse Rate 75     Resp      Temp 98.8 F (37.1 C)     Temp Source Oral     SpO2 99 %     Weight      Height      Head Circumference      Peak Flow      Pain Score 10     Pain Loc      Pain Edu?      Excl. in GC?  No data found.   Updated Vital Signs BP 118/77 (BP Location: Right Arm)   Pulse 75   Temp 98.8 F (37.1 C) (Oral)   SpO2 99%   Visual Acuity Right Eye Distance:   Left Eye Distance:   Bilateral Distance:    Right Eye Near:   Left Eye Near:    Bilateral Near:     Physical Exam  Constitutional: She is oriented to person, place, and time. She appears well-developed and well-nourished. No distress.  Lymphadenopathy:    She has no cervical adenopathy.  Neurological: She is alert and oriented to person, place, and time.  Skin: Skin is warm and dry. Rash noted. There is erythema.  Papulopustular acne across face  Nursing note and vitals reviewed.    UC Treatments / Results  Labs (all labs ordered are listed, but only abnormal results are displayed) Labs Reviewed - No data to display  EKG  EKG Interpretation None       Radiology No results found.  Procedures Procedures (including critical care time)  Medications Ordered in UC Medications - No data to display   Initial Impression / Assessment and Plan / UC Course  I  have reviewed the triage vital signs and the nursing notes.  Pertinent labs & imaging results that were available during my care of the patient were reviewed by me and considered in my medical decision making (see chart for details).       Final Clinical Impressions(s) / UC Diagnoses   Final diagnoses:  Acne vulgaris    New Prescriptions New Prescriptions   CLINDAMYCIN-BENZOYL PEROXIDE (BENZACLIN) GEL    Apply topically 2 (two) times daily.   MINOCYCLINE (MINOCIN,DYNACIN) 100 MG CAPSULE    Take 1 capsule (100 mg total) by mouth 2 (two) times daily.     Linna HoffJames D Fayette Hamada, MD 07/08/16 [phone]

## 2016-10-20 ENCOUNTER — Ambulatory Visit (INDEPENDENT_AMBULATORY_CARE_PROVIDER_SITE_OTHER): Payer: BLUE CROSS/BLUE SHIELD | Admitting: Orthopedic Surgery

## 2016-10-20 ENCOUNTER — Ambulatory Visit (INDEPENDENT_AMBULATORY_CARE_PROVIDER_SITE_OTHER): Payer: BLUE CROSS/BLUE SHIELD

## 2016-10-20 ENCOUNTER — Encounter (INDEPENDENT_AMBULATORY_CARE_PROVIDER_SITE_OTHER): Payer: Self-pay | Admitting: Orthopedic Surgery

## 2016-10-20 VITALS — Ht 60.0 in | Wt 140.0 lb

## 2016-10-20 DIAGNOSIS — M25561 Pain in right knee: Secondary | ICD-10-CM | POA: Diagnosis not present

## 2016-10-20 DIAGNOSIS — G8929 Other chronic pain: Secondary | ICD-10-CM | POA: Diagnosis not present

## 2016-10-20 MED ORDER — METHYLPREDNISOLONE ACETATE 40 MG/ML IJ SUSP
40.0000 mg | INTRAMUSCULAR | Status: AC | PRN
  Administered 2016-10-20: 40 mg via INTRA_ARTICULAR

## 2016-10-20 MED ORDER — LIDOCAINE HCL 1 % IJ SOLN
5.0000 mL | INTRAMUSCULAR | Status: AC | PRN
  Administered 2016-10-20: 5 mL

## 2016-10-20 NOTE — Progress Notes (Signed)
Office Visit Note   Patient: Kathleen CrockerJasmine M Gali           Date of Birth: [birth year]           MRN: [id number] Visit Date: 10/20/2016              Requested by: Merrilee SeashoreGrant, Kelly K, FNP [address] [place], [place] [id number] PCP: Merrilee SeashoreGrant, Kelly K, FNP  Chief Complaint  Patient presents with  . Right Knee - Pain      HPI: Patient is a 21 year old woman who presents complaining of chronic right knee pain she states is been getting worse over the past week. She states she previously was diagnosed with patella tendinitis she has been using crutches for 6 weeks she did some physical therapy. She states the pain is circumferentially around the patella. She is currently wearing a hinged brace. She uses ibuprofen for pain she states that sometimes just twisted her knee to home.  Assessment & Plan: Visit Diagnoses:  1. Chronic pain of right knee     Plan: Recommended VMO strengthening to improve the tracking of the patella. The knee was injected. Follow-up as needed. Recommend that she discontinue the brace this may be placing too much pressure on the patella.  Follow-Up Instructions: Return if symptoms worsen or fail to improve.   Ortho Exam  Patient is alert, oriented, no adenopathy, well-dressed, normal affect, normal respiratory effort. Patient has a antalgic gait. Examination there is no effusion there is no redness there is no hypersensitivity to light touch. Patient's collaterals and cruciates are stable. She is tender to palpation in the patellofemoral joint medial lateral joint lines are nontender to palpation. She has crepitation of patellofemoral joint with range of motion.  Imaging: Xr Knee 3 View Right  Result Date: 10/20/2016 Three-view radiographs of the right knee shows good maintenance of the joint space medially and laterally no osteophytic bone spurs no subcondylar cyst no periarticular bony changes. Sunrise view show lateral tracking of the patella worse on the right  than the left.   Labs: Lab Results  Component Value Date   REPTSTATUS 05/07/2016 FINAL 05/06/2016   CULT NO GROWTH [facility]  05/06/2016    Orders:  Orders Placed This Encounter  Procedures  . XR KNEE 3 VIEW RIGHT   No orders of the defined types were placed in this encounter.    Procedures: Large Joint Inj Date/Time: 10/20/2016 8:43 AM Performed by: Orvin Netter V Authorized by: Nadara MustardUDA, Laprecious Austill V   Consent Given by:  Patient Site marked: the procedure site was marked   Timeout: prior to procedure the correct patient, procedure, and site was verified   Indications:  Pain and diagnostic evaluation Location:  Knee Site:  R knee Prep: patient was prepped and draped in usual sterile fashion   Needle Size:  22 G Needle Length:  1.5 inches Approach:  Anteromedial Ultrasound Guidance: No   Fluoroscopic Guidance: No   Arthrogram: No   Medications:  5 mL lidocaine 1 %; 40 mg methylPREDNISolone acetate 40 MG/ML Aspiration Attempted: No   Patient tolerance:  Patient tolerated the procedure well with no immediate complications    Clinical Data: No additional findings.  ROS:  All other systems negative, except as noted in the HPI. Review of Systems  Objective: Vital Signs: Ht 5' (1.524 m)   Wt 140 lb (63.5 kg)   BMI 27.34 kg/m   Specialty Comments:  No specialty comments available.  PMFS  History: Patient Active Problem List   Diagnosis Date Noted  . Postpartum care following vaginal delivery 06/09/2016  . Term pregnancy 06/08/2016  . SVD (spontaneous vaginal delivery) 06/08/2016  . Migraine without aura and without status migrainosus, not intractable 05/02/2014  . Migraines    Past Medical History:  Diagnosis Date  . Anxiety attack   . Asthma   . Herpes   . Migraines   . Sickle-cell trait (HCC)     Family History  Problem Relation Age of Onset  . Bone cancer Mother     Past Surgical History:  Procedure Laterality Date  .  TONSILLECTOMY    . WISDOM TOOTH EXTRACTION     Social History   Occupational History  . Not on file.   Social History Main Topics  . Smoking status: Never Smoker  . Smokeless tobacco: Never Used  . Alcohol use No     Comment: social  . Drug use: No  . Sexual activity: Yes    Partners: Male

## 2019-06-19 DIAGNOSIS — F419 Anxiety disorder, unspecified: Secondary | ICD-10-CM | POA: Insufficient documentation

## 2019-07-31 ENCOUNTER — Other Ambulatory Visit: Payer: Self-pay

## 2019-07-31 ENCOUNTER — Ambulatory Visit: Payer: 59 | Admitting: Critical Care Medicine

## 2019-07-31 VITALS — BP 115/68 | HR 66 | Temp 97.8°F | Resp 18 | Ht 61.0 in | Wt 119.0 lb

## 2019-07-31 DIAGNOSIS — J4520 Mild intermittent asthma, uncomplicated: Secondary | ICD-10-CM

## 2019-07-31 DIAGNOSIS — K219 Gastro-esophageal reflux disease without esophagitis: Secondary | ICD-10-CM | POA: Diagnosis not present

## 2019-07-31 DIAGNOSIS — R079 Chest pain, unspecified: Secondary | ICD-10-CM | POA: Diagnosis not present

## 2019-07-31 DIAGNOSIS — F419 Anxiety disorder, unspecified: Secondary | ICD-10-CM | POA: Diagnosis not present

## 2019-07-31 DIAGNOSIS — D573 Sickle-cell trait: Secondary | ICD-10-CM | POA: Insufficient documentation

## 2019-07-31 MED ORDER — PANTOPRAZOLE SODIUM 40 MG PO TBEC: 40.0000 mg | DELAYED_RELEASE_TABLET | Freq: Every day | ORAL | 3 refills | Status: DC

## 2019-07-31 MED ORDER — ALBUTEROL SULFATE HFA 108 (90 BASE) MCG/ACT IN AERS: 2.0000 | INHALATION_SPRAY | RESPIRATORY_TRACT | 0 refills | Status: DC | PRN

## 2019-07-31 NOTE — Assessment & Plan Note (Signed)
Chest pain syndrome most compatible with reflux with potential esophageal spasm  Note EKG today was completely normal and chest pain is very atypical for pain  See reflux assessment

## 2019-07-31 NOTE — Progress Notes (Signed)
Subjective:    Patient ID: Kathleen Spears, female    DOB: [birth year], 24 y.o.   MRN: [id number]  This is a pleasant 24 year old female who comes to the mobile medicine clinic wishing to be evaluated for chest pain.  The patient is juggling a busy schedule and that she is pursuing education around childcare and early education, she is working in a childcare center here at [employer], and also has a history of depression and anxiety is not currently on Lexapro 10 mg daily per her primary care provider.  This patient had a great deal stress that she is noted for the past month onset subacutely of central substernal chest pain.  Please see chest pain assessment below  The pain has been constant in nature and does not radiate she does note increased belching and burping and there is irritation in the throat with associated cough.  She does have a history of asthma has been on albuterol in the past as well   Chest Pain  This is a new problem. The current episode started more than 1 month ago. The onset quality is gradual. The problem occurs constantly. The problem has been rapidly worsening. The pain is at a severity of 8/10. The pain is severe. The quality of the pain is described as squeezing. The pain does not radiate. Associated symptoms include a cough, dizziness, exertional chest pressure, malaise/fatigue, palpitations and shortness of breath. Pertinent negatives include no back pain, diaphoresis, hemoptysis, irregular heartbeat, leg pain, nausea or syncope. Associated symptoms comments: Insomnia . The pain is aggravated by exertion and coughing. Risk factors include oral contraceptive use.   Past Medical History:  Diagnosis Date  . Anxiety attack   . Asthma   . Herpes   . Migraines   . Sickle-cell trait ([facility])      Family History  Problem Relation Age of Onset  . Bone cancer Mother      Social History   Socioeconomic History  . Marital status: Single    Spouse name: Not on file  .  Number of children: Not on file  . Years of education: Not on file  . Highest education level: Not on file  Occupational History  . Not on file  Tobacco Use  . Smoking status: Never Smoker  . Smokeless tobacco: Never Used  Substance and Sexual Activity  . Alcohol use: No    Alcohol/week: 0.0 standard drinks    Comment: social  . Drug use: No  . Sexual activity: Yes    Partners: Male  Other Topics Concern  . Not on file  Social History Narrative  . Not on file   Social Determinants of Health   Financial Resource Strain:   . Difficulty of Paying Living Expenses:   Food Insecurity:   . Worried About Charity fundraiser in the Last Year:   . Arboriculturist in the Last Year:   Transportation Needs:   . Film/video editor (Medical):   [name] Lack of Transportation (Non-Medical):   Physical Activity:   . Days of Exercise per Week:   . Minutes of Exercise per Session:   Stress:   . Feeling of Stress :   Social Connections:   . Frequency of Communication with Friends and Family:   . Frequency of Social Gatherings with Friends and Family:   . Attends Religious Services:   . Active Member of Clubs or Organizations:   . Attends Archivist Meetings:   .  Marital Status:   Intimate Partner Violence:   . Fear of Current or Ex-Partner:   . Emotionally Abused:   [name] Physically Abused:   . Sexually Abused:      Allergies  Allergen Reactions  . Hydrocodone Nausea And Vomiting     Outpatient Medications Prior to Visit  Medication Sig Dispense Refill  . escitalopram (LEXAPRO) 10 MG tablet Take 10 mg by mouth daily.    . norethindrone (AYGESTIN) 5 MG tablet Take 5 mg by mouth daily.    . clindamycin-benzoyl peroxide (BENZACLIN) gel Apply topically 2 (two) times daily. (Patient not taking: Reported on 07/31/2019) 50 g 1  . ibuprofen (ADVIL,MOTRIN) 600 MG tablet Take 1 tablet (600 mg total) by mouth every 6 (six) hours. (Patient not taking: Reported on 07/31/2019) 30 tablet 0   . minocycline (MINOCIN,DYNACIN) 100 MG capsule Take 1 capsule (100 mg total) by mouth 2 (two) times daily. (Patient not taking: Reported on 07/31/2019) 100 capsule 0  . Prenatal MV-Min-FA-Omega-3 (PRENATAL GUMMIES/DHA & FA) 0.4-32.5 MG CHEW Chew 2 each by mouth daily.     No facility-administered medications prior to visit.      Review of Systems  Constitutional: Positive for malaise/fatigue. Negative for diaphoresis.  Respiratory: Positive for cough and shortness of breath. Negative for hemoptysis.   Cardiovascular: Positive for chest pain and palpitations. Negative for syncope.  Gastrointestinal: Negative for nausea.  Musculoskeletal: Negative for back pain.  Neurological: Positive for dizziness.       Objective:   Physical Exam Vitals:   07/31/19 1115  BP: 115/68  Pulse: 66  Resp: 18  Temp: 97.8 F (36.6 C)  TempSrc: Oral  Weight: 119 lb (54 kg)  Height: 5\' 1"  (1.549 m)    Gen: Pleasant, well-nourished, in no distress, anxious affect  ENT: No lesions,  mouth clear,  oropharynx clear, no postnasal drip  Neck: No JVD, no TMG, no carotid bruits  Lungs: No use of accessory muscles, no dullness to percussion, clear without rales or rhonchi  Cardiovascular: RRR, heart sounds normal, no murmur or gallops, no peripheral edema, there is no chest wall tenderness noted  Abdomen: soft , tender in the epigastric area but no rebound or guarding, no HSM,  BS normal  Musculoskeletal: No deformities, no cyanosis or clubbing  Neuro: alert, non focal  Skin: Warm, no lesions or rashes  EKG performed showed normal sinus rhythm no acute changes     Assessment & Plan:  I personally reviewed all images and lab data in the [facility] system as well as any outside material available during this office visit and agree with the  radiology impressions.   Chest pain Chest pain syndrome most compatible with reflux with potential esophageal spasm  Note EKG today was completely normal and  chest pain is very atypical for pain  See reflux assessment  GERD (gastroesophageal reflux disease) Reflux disease including high level reflux resulting in cough paroxysms wheezing  While today the patient's chest exam is completely normal it is not unreasonable to give the patient as needed albuterol inhaler  We will begin Protonix 40 mg daily she will take this before meals and also I went over with the patient in detail the reflux diet that she should try to adhere to his diet she can  Asthma History of mild intermittent asthma currently her symptoms appear to be triggered by reflux  We will prescribe 1 inhaler albuterol 2 inhalations every 6 hours as needed   Diagnoses and all orders for  this visit:  Chest pain, unspecified type -     EKG 12-Lead  Gastroesophageal reflux disease without esophagitis  Anxiety  Mild intermittent asthma without complication  Other orders -     pantoprazole (PROTONIX) 40 MG tablet; Take 1 tablet (40 mg total) by mouth daily. -     albuterol (VENTOLIN HFA) 108 (90 Base) MCG/ACT inhaler; Inhale 2 puffs into the lungs every 4 (four) hours as needed for wheezing or shortness of breath.

## 2019-07-31 NOTE — Assessment & Plan Note (Addendum)
History of mild intermittent asthma currently her symptoms appear to be triggered by reflux  We will prescribe 1 inhaler albuterol 2 inhalations every 6 hours as needed

## 2019-07-31 NOTE — Progress Notes (Signed)
Pain has been present for the past month intermittently. Pain experienced pain years prior. Pain is not associated with N/V. Dizziness this morning.

## 2019-07-31 NOTE — Assessment & Plan Note (Signed)
Reflux disease including high level reflux resulting in cough paroxysms wheezing  While today the patient's chest exam is completely normal it is not unreasonable to give the patient as needed albuterol inhaler  We will begin Protonix 40 mg daily she will take this before meals and also I went over with the patient in detail the reflux diet that she should try to adhere to his diet she can

## 2019-07-31 NOTE — Patient Instructions (Addendum)
Begin protonix 40mg  one capsule taken 15 minutes before breakfast, then eat.  Follow a strict refux diet as below  Contact your primary care physician for a follow up visit   Food Choices for Gastroesophageal Reflux Disease, Adult When you have gastroesophageal reflux disease (GERD), the foods you eat and your eating habits are very important. Choosing the right foods can help ease your discomfort. Think about working with a nutrition specialist (dietitian) to help you make good choices. What are tips for following this plan?  Meals  Choose healthy foods that are low in fat, such as fruits, vegetables, whole grains, low-fat dairy products, and lean meat, fish, and poultry.  Eat small meals often instead of 3 large meals a day. Eat your meals slowly, and in a place where you are relaxed. Avoid bending over or lying down until 2-3 hours after eating.  Avoid eating meals 2-3 hours before bed.  Avoid drinking a lot of liquid with meals.  Cook foods using methods other than frying. Bake, grill, or broil food instead.  Avoid or limit: ? Chocolate. ? Peppermint or spearmint. ? Alcohol. ? Pepper. ? Black and decaffeinated coffee. ? Black and decaffeinated tea. ? Bubbly (carbonated) soft drinks. ? Caffeinated energy drinks and soft drinks.  Limit high-fat foods such as: ? Fatty meat or fried foods. ? Whole milk, cream, butter, or ice cream. ? Nuts and nut butters. ? Pastries, donuts, and sweets made with butter or shortening.  Avoid foods that cause symptoms. These foods may be different for everyone. Common foods that cause symptoms include: ? Tomatoes. ? Oranges, lemons, and limes. ? Peppers. ? Spicy food. ? Onions and garlic. ? Vinegar. Lifestyle  Maintain a healthy weight. Ask your doctor what weight is healthy for you. If you need to lose weight, work with your doctor to do so safely.  Exercise for at least 30 minutes for 5 or more days each week, or as told by your  doctor.  Wear loose-fitting clothes.  Do not smoke. If you need help quitting, ask your doctor.  Sleep with the head of your bed higher than your feet. Use a wedge under the mattress or blocks under the bed frame to raise the head of the bed. Summary  When you have gastroesophageal reflux disease (GERD), food and lifestyle choices are very important in easing your symptoms.  Eat small meals often instead of 3 large meals a day. Eat your meals slowly, and in a place where you are relaxed.  Limit high-fat foods such as fatty meat or fried foods.  Avoid bending over or lying down until 2-3 hours after eating.  Avoid peppermint and spearmint, caffeine, alcohol, and chocolate. This information is not intended to replace advice given to you by your health care provider. Make sure you discuss any questions you have with your health care provider. Document Revised: 08/23/2018 Document Reviewed: 06/07/2016 Elsevier Patient Education  2020 06/09/2016.

## 2019-08-27 ENCOUNTER — Ambulatory Visit (HOSPITAL_COMMUNITY)
Admission: EM | Admit: 2019-08-27 | Discharge: 2019-08-27 | Disposition: A | Discharge status: Home / Self Care | Payer: 59 | Attending: Family Medicine | Admitting: Family Medicine

## 2019-08-27 ENCOUNTER — Encounter (HOSPITAL_COMMUNITY): Payer: Self-pay

## 2019-08-27 ENCOUNTER — Other Ambulatory Visit: Payer: Self-pay

## 2019-08-27 DIAGNOSIS — M25572 Pain in left ankle and joints of left foot: Secondary | ICD-10-CM

## 2019-08-27 MED ORDER — IBUPROFEN 600 MG PO TABS: 600.0000 mg | ORAL_TABLET | Freq: Three times a day (TID) | ORAL | 0 refills | Status: DC | PRN

## 2019-08-27 NOTE — Discharge Instructions (Addendum)
Most likely this is some sort of tendinitis issue. We will give you a ankle lace up brace to wear here for compression and pain. Rest, ice, elevate the foot Ibuprofen for pain every 8 hours Work note given for light duty for 1 week

## 2019-08-27 NOTE — ED Triage Notes (Signed)
Pt states she has left ankle pain. Pt states it just started out of the blue just hurting this started last night.

## 2019-08-28 ENCOUNTER — Ambulatory Visit: Payer: 59 | Attending: Internal Medicine

## 2019-08-28 DIAGNOSIS — Z20822 Contact with and (suspected) exposure to covid-19: Secondary | ICD-10-CM

## 2019-08-28 NOTE — ED Provider Notes (Signed)
[facility]    CSN: [id number] Arrival date & time: 08/27/19  1117      History   Chief Complaint Chief Complaint  Patient presents with  . Ankle Pain    HPI Kathleen Spears is a 24 y.o. female.   Patient is a 24 year old female presents today for left ankle pain.  This started last night.  History of tendinitis.  Denies any specific injuries to the ankle.  She is able to bear weight and has full range of motion.  Mild swelling but no erythema, bruising or deformity.  Sensation intact she has not taken anything for the symptoms.  ROS per HPI      Past Medical History:  Diagnosis Date  . Anxiety attack   . Asthma   . Herpes   . Migraines   . Sickle-cell trait [facility])     Patient Active Problem List   Diagnosis Date Noted  . Sickle cell trait (HCC) 07/31/2019  . GERD (gastroesophageal reflux disease) 07/31/2019  . Chest pain 07/31/2019  . Anxiety 06/19/2019  . Asthma 07/08/2015  . Migraine without aura and without status migrainosus, not intractable 05/02/2014    Past Surgical History:  Procedure Laterality Date  . TONSILLECTOMY    . WISDOM TOOTH EXTRACTION      OB History    Gravida  1   Para  1   Term  1   Preterm      AB      Living  1     SAB      TAB      Ectopic      Multiple  0   Live Births  1            Home Medications    Prior to Admission medications   Medication Sig Start Date End Date Taking? Authorizing Provider  albuterol (VENTOLIN HFA) 108 (90 Base) MCG/ACT inhaler Inhale 2 puffs into the lungs every 4 (four) hours as needed for wheezing or shortness of breath. 07/31/19   Storm Frisk, MD  escitalopram (LEXAPRO) 10 MG tablet Take 10 mg by mouth daily. 07/23/19   [provider]  ibuprofen (ADVIL) 600 MG tablet Take 1 tablet (600 mg total) by mouth every 8 (eight) hours as needed for moderate pain. 08/27/19   Dahlia Byes A, NP  norethindrone (AYGESTIN) 5 MG tablet Take 5 mg by mouth daily.  06/28/19   [provider]  pantoprazole (PROTONIX) 40 MG tablet Take 1 tablet (40 mg total) by mouth daily. 07/31/19   Storm Frisk, MD  Prenatal MV-Min-FA-Omega-3 (PRENATAL GUMMIES/DHA & FA) 0.4-32.5 MG CHEW Chew 2 each by mouth daily.    [provider]    Family History Family History  Problem Relation Age of Onset  . Bone cancer Mother     Social History Social History   Tobacco Use  . Smoking status: Never Smoker  . Smokeless tobacco: Never Used  Substance Use Topics  . Alcohol use: No    Alcohol/week: 0.0 standard drinks    Comment: social  . Drug use: No     Allergies   Hydrocodone   Review of Systems Review of Systems   Physical Exam Triage Vital Signs ED Triage Vitals  Enc Vitals Group     BP 08/27/19 1229 117/75     Pulse Rate 08/27/19 1229 74     Resp 08/27/19 1229 16     Temp 08/27/19 1229 98.3 F (36.8 C)  Temp Source 08/27/19 1229 Oral     SpO2 08/27/19 1229 100 %     Weight 08/27/19 1231 123 lb (55.8 kg)     Height --      Head Circumference --      Peak Flow --      Pain Score 08/27/19 1231 10     Pain Loc --      Pain Edu? --      Excl. in GC? --    No data found.  Updated Vital Signs BP 117/75 (BP Location: Right Arm)   Pulse 74   Temp 98.3 F (36.8 C) (Oral)   Resp 16   Wt 123 lb (55.8 kg)   LMP 07/30/2019   SpO2 100%   BMI 23.24 kg/m   Visual Acuity Right Eye Distance:   Left Eye Distance:   Bilateral Distance:    Right Eye Near:   Left Eye Near:    Bilateral Near:     Physical Exam Vitals and nursing note reviewed.  Constitutional:      General: She is not in acute distress.    Appearance: Normal appearance. She is not ill-appearing, toxic-appearing or diaphoretic.  HENT:     Head: Normocephalic.     Nose: Nose normal.  Eyes:     Conjunctiva/sclera: Conjunctivae normal.  Pulmonary:     Effort: Pulmonary effort is normal.  Musculoskeletal:     Cervical back: Normal range of motion.      Left ankle: Swelling present. Tenderness present over the lateral malleolus. Decreased range of motion. Normal pulse.     Left Achilles Tendon: Normal.       Feet:  Skin:    General: Skin is warm and dry.     Findings: No rash.  Neurological:     Mental Status: She is alert.  Psychiatric:        Mood and Affect: Mood normal.      UC Treatments / Results  Labs (all labs ordered are listed, but only abnormal results are displayed) Labs Reviewed - No data to display  EKG   Radiology No results found.  Procedures Procedures (including critical care time)  Medications Ordered in UC Medications - No data to display  Initial Impression / Assessment and Plan / UC Course  I have reviewed the triage vital signs and the nursing notes.  Pertinent labs & imaging results that were available during my care of the patient were reviewed by me and considered in my medical decision making (see chart for details).     Ankle pain--most likely tendinitis.  ASO given here in clinic We will have her rest, ice, elevate the foot.  Ibuprofen for pain every 8 hours as needed. Work note given for light duty for 1 week Final Clinical Impressions(s) / UC Diagnoses   Final diagnoses:  Acute left ankle pain     Discharge Instructions     Most likely this is some sort of tendinitis issue. We will give you a ankle lace up brace to wear here for compression and pain. Rest, ice, elevate the foot Ibuprofen for pain every 8 hours Work note given for light duty for 1 week     ED Prescriptions    Medication Sig Dispense Auth. Provider   ibuprofen (ADVIL) 600 MG tablet Take 1 tablet (600 mg total) by mouth every 8 (eight) hours as needed for moderate pain. 30 tablet Dahlia Byes A, NP     PDMP not reviewed this encounter.  Orvan July, NP 08/28/19 1327

## 2019-08-29 ENCOUNTER — Ambulatory Visit: Payer: Self-pay | Admitting: Orthopedic Surgery

## 2019-08-29 LAB — NOVEL CORONAVIRUS, NAA: SARS-CoV-2, NAA: NOT DETECTED

## 2019-08-29 LAB — SARS-COV-2, NAA 2 DAY TAT

## 2019-09-02 ENCOUNTER — Ambulatory Visit (INDEPENDENT_AMBULATORY_CARE_PROVIDER_SITE_OTHER): Payer: 59

## 2019-09-02 ENCOUNTER — Ambulatory Visit: Payer: Medicaid Other | Admitting: Orthopedic Surgery

## 2019-09-02 ENCOUNTER — Other Ambulatory Visit: Payer: Self-pay

## 2019-09-02 ENCOUNTER — Ambulatory Visit (INDEPENDENT_AMBULATORY_CARE_PROVIDER_SITE_OTHER): Payer: 59 | Admitting: Orthopedic Surgery

## 2019-09-02 ENCOUNTER — Encounter: Payer: Self-pay | Admitting: Orthopedic Surgery

## 2019-09-02 VITALS — Ht 61.0 in | Wt 123.0 lb

## 2019-09-02 DIAGNOSIS — M25572 Pain in left ankle and joints of left foot: Secondary | ICD-10-CM | POA: Diagnosis not present

## 2019-09-02 DIAGNOSIS — M25561 Pain in right knee: Secondary | ICD-10-CM

## 2019-09-02 DIAGNOSIS — G8929 Other chronic pain: Secondary | ICD-10-CM

## 2019-09-02 NOTE — Progress Notes (Signed)
Office Visit Note   Patient: Kathleen Spears           Date of Birth: 27-Dec-1995           MRN: [id number] Visit Date: 09/02/2019              Requested by: Radene Journey, [place],  [place] [address] PCP: Radene Journey, FNP  Chief Complaint  Patient presents with  . Right Knee - Pain  . Left Ankle - Pain      HPI: Patient is a 24 year old woman who presents complaining of right knee pain globally around the patella she is wearing a neoprene brace she also complains of lateral ankle pain she is uncertain if she had any type of traumatic injury.  Assessment & Plan: Visit Diagnoses:  1. Chronic pain of right knee   2. Pain in left ankle and joints of left foot     Plan: Patient was given a note that stated that she can wear her knee brace and ankle brace for work.  Recommended ankle strengthening on the left and she was given instructions and demonstrated quad and VMO strengthening for the right knee.  Discussed that her symptoms are from maltracking in the patellofemoral joint and she needs VMO strengthening.  Follow-Up Instructions: No follow-ups on file.   Ortho Exam  Patient is alert, oriented, no adenopathy, well-dressed, normal affect, normal respiratory effort. Examination patient has an antalgic gait she has good range of motion of the left ankle good pulses there is no redness no cellulitis no swelling she is only tender to palpation over the anterior talofibular ligament anterior drawer is stable.  Examination of the right knee she has lateral tracking of the patella she has crepitation of patellofemoral joint with range of motion there is no effusion medial lateral facets of the patella are tender to palpation medial and lateral joint lines are minimally tender to palpation collaterals and cruciates are stable.  Imaging: XR Ankle 2 Views Left  Result Date: 09/02/2019 2 view radiographs of the left ankle shows a congruent mortise no  osteochondral defects.  XR Knee 1-2 Views Right  Result Date: 09/02/2019 2 view radiographs of the right knee shows normal alignment no subchondral sclerosis or cyst no periarticular bony spurs.  No images are attached to the encounter.  Labs: Lab Results  Component Value Date   REPTSTATUS 05/07/2016 FINAL 05/06/2016   CULT NO GROWTH Performed at [facility]  05/06/2016     Lab Results  Component Value Date   ALBUMIN 3.4 (L) 05/06/2016   ALBUMIN 3.4 (L) 04/04/2016    No results found for: MG No results found for: VD25OH  No results found for: PREALBUMIN CBC EXTENDED Latest Ref Rng & Units 06/09/2016 06/08/2016 06/08/2016  WBC 4.0 - 10.5 K/uL 22.7(H) 19.6(H) 5.6  RBC 3.87 - 5.11 MIL/uL 4.62 4.86 4.98  HGB 12.0 - 15.0 g/dL 11.9(L) 12.8 12.8  HCT 36.0 - 46.0 % 34.4(L) 36.5 37.0  PLT 150 - 400 K/uL 155 142(L) 147(L)  NEUTROABS 1.7 - 7.7 K/uL - - -  LYMPHSABS 0.7 - 4.0 K/uL - - -     Body mass index is 23.24 kg/m.  Orders:  Orders Placed This Encounter  Procedures  . XR Knee 1-2 Views Right  . XR Ankle 2 Views Left   No orders of the defined types were placed in this encounter.    Procedures: No procedures performed  Clinical Data: No additional findings.  ROS:  All other systems negative, except as noted in the HPI. Review of Systems  Objective: Vital Signs: Ht 5\' 1"  (1.549 m)   Wt 123 lb (55.8 kg)   BMI 23.24 kg/m   Specialty Comments:  No specialty comments available.  PMFS History: Patient Active Problem List   Diagnosis Date Noted  . Sickle cell trait (HCC) 07/31/2019  . GERD (gastroesophageal reflux disease) 07/31/2019  . Chest pain 07/31/2019  . Anxiety 06/19/2019  . Asthma 07/08/2015  . Migraine without aura and without status migrainosus, not intractable 05/02/2014   Past Medical History:  Diagnosis Date  . Anxiety attack   . Asthma   . Herpes   . Migraines   . Sickle-cell trait (HCC)     Family History  Problem  Relation Age of Onset  . Bone cancer Mother     Past Surgical History:  Procedure Laterality Date  . TONSILLECTOMY    . WISDOM TOOTH EXTRACTION     Social History   Occupational History  . Not on file  Tobacco Use  . Smoking status: Never Smoker  . Smokeless tobacco: Never Used  Substance and Sexual Activity  . Alcohol use: No    Alcohol/week: 0.0 standard drinks    Comment: social  . Drug use: No  . Sexual activity: Yes    Partners: Male

## 2019-11-21 ENCOUNTER — Other Ambulatory Visit: Payer: Self-pay

## 2019-11-21 ENCOUNTER — Emergency Department (HOSPITAL_COMMUNITY): Payer: Self-pay

## 2019-11-21 ENCOUNTER — Emergency Department (HOSPITAL_COMMUNITY)
Admission: EM | Admit: 2019-11-21 | Discharge: 2019-11-22 | Disposition: A | Discharge status: Home / Self Care | Payer: Self-pay | Attending: Emergency Medicine | Admitting: Emergency Medicine

## 2019-11-21 ENCOUNTER — Encounter (HOSPITAL_COMMUNITY): Payer: Self-pay | Admitting: Emergency Medicine

## 2019-11-21 DIAGNOSIS — Z5321 Procedure and treatment not carried out due to patient leaving prior to being seen by health care provider: Secondary | ICD-10-CM | POA: Insufficient documentation

## 2019-11-21 DIAGNOSIS — R0789 Other chest pain: Secondary | ICD-10-CM | POA: Insufficient documentation

## 2019-11-21 DIAGNOSIS — R0602 Shortness of breath: Secondary | ICD-10-CM | POA: Insufficient documentation

## 2019-11-21 LAB — BASIC METABOLIC PANEL
Anion gap: 9 (ref 5–15)
BUN: 10 mg/dL (ref 6–20)
CO2: 25 mmol/L (ref 22–32)
Calcium: 9.5 mg/dL (ref 8.9–10.3)
Chloride: 105 mmol/L (ref 98–111)
Creatinine, Ser: 0.79 mg/dL (ref 0.44–1.00)
GFR calc Af Amer: >60 mL/min (ref >60)
GFR calc non Af Amer: >60 mL/min (ref >60)
Glucose, Bld: 94 mg/dL (ref 70–99)
Potassium: 3.7 mmol/L (ref 3.5–5.1)
Sodium: 139 mmol/L (ref 135–145)

## 2019-11-21 LAB — CBC
HCT: 39.2 % (ref 36.0–46.0)
Hemoglobin: 13.1 g/dL (ref 12.0–15.0)
MCH: 26.6 pg (ref 26.0–34.0)
MCHC: 33.4 g/dL (ref 30.0–36.0)
MCV: 79.7 fL — ABNORMAL LOW (ref 80.0–100.0)
Platelets: 229 10*3/uL (ref 150–400)
RBC: 4.92 MIL/uL (ref 3.87–5.11)
RDW: 13.5 % (ref 11.5–15.5)
WBC: 7.5 10*3/uL (ref 4.0–10.5)
nRBC: 0 % (ref 0.0–0.2)

## 2019-11-21 LAB — TROPONIN I (HIGH SENSITIVITY): Troponin I (High Sensitivity): <2 ng/L (ref <18)

## 2019-11-21 LAB — I-STAT BETA HCG BLOOD, ED (MC, WL, AP ONLY): I-stat hCG, quantitative: <5 m[IU]/mL (ref <5)

## 2019-11-21 MED ORDER — SODIUM CHLORIDE 0.9% FLUSH: 3.0000 mL | Freq: Once | INTRAVENOUS | Status: DC

## 2019-11-21 NOTE — ED Triage Notes (Signed)
Patient reports central chest pain onset Monday this week with mild SOB/occasional dry cough , denies emesis or diaphoresis .

## 2019-11-22 NOTE — ED Notes (Signed)
Pt called by another tech, no response

## 2020-06-08 ENCOUNTER — Other Ambulatory Visit: Payer: Self-pay | Admitting: Critical Care Medicine

## 2020-08-27 ENCOUNTER — Ambulatory Visit: Payer: Medicaid Other | Admitting: Orthopedic Surgery

## 2020-09-02 ENCOUNTER — Other Ambulatory Visit: Payer: Self-pay

## 2020-09-02 ENCOUNTER — Ambulatory Visit: Payer: BC Managed Care – PPO | Admitting: Critical Care Medicine

## 2020-09-02 VITALS — BP 113/70 | HR 82 | Temp 98.2°F | Resp 18 | Ht 62.5 in | Wt 130.0 lb

## 2020-09-02 DIAGNOSIS — J029 Acute pharyngitis, unspecified: Secondary | ICD-10-CM | POA: Diagnosis not present

## 2020-09-02 DIAGNOSIS — R0982 Postnasal drip: Secondary | ICD-10-CM

## 2020-09-02 DIAGNOSIS — J309 Allergic rhinitis, unspecified: Secondary | ICD-10-CM | POA: Diagnosis not present

## 2020-09-02 DIAGNOSIS — J4520 Mild intermittent asthma, uncomplicated: Secondary | ICD-10-CM | POA: Diagnosis not present

## 2020-09-02 LAB — POC COVID19 BINAXNOW: SARS Coronavirus 2 Ag: NEGATIVE

## 2020-09-02 MED ORDER — FLUTICASONE PROPIONATE 50 MCG/ACT NA SUSP
2.0000 | Freq: Every day | NASAL | 6 refills | Status: DC
Start: 2020-09-02 — End: 2023-03-18

## 2020-09-02 MED ORDER — CETIRIZINE HCL 10 MG PO TABS
10.0000 mg | ORAL_TABLET | Freq: Every day | ORAL | 11 refills | Status: AC
Start: 2020-09-02 — End: ?

## 2020-09-02 NOTE — Assessment & Plan Note (Signed)
Appears to be either a mild case of viral pharyngitis or allergic rhinitis we will treat with Flonase and oral antihistamine

## 2020-09-02 NOTE — Progress Notes (Signed)
Patient has used tylenol for HA. Patient denies any one in household with similar symptoms.

## 2020-09-02 NOTE — Assessment & Plan Note (Signed)
No evidence of acute flare monitor

## 2020-09-02 NOTE — Patient Instructions (Signed)
Start Flonase 2 sprays each nostril daily  Begin cetirizine 1 tablet daily  Both of the above medications were sent to your pharmacy and are for allergic rhinitis which I believe is the main source of your current symptoms  Your COVID test was negative  You may return to work you are not contagious  Rink plenty of fluids  See instructions below  We will establish you at [facility] for primary care and appointment will be scheduled at a later date   Allergic Rhinitis, Adult Allergic rhinitis is a reaction to allergens. Allergens are things that can cause an allergic reaction. This condition affects the lining inside the nose (mucous membrane). There are two types of allergic rhinitis:  Seasonal. This type is also called hay fever. It happens only during some times of the year.  Perennial. This type can happen at any time of the year. This condition cannot be spread from person to person (is not contagious). It can be mild, worse, or very bad. It can develop at any age and may be outgrown. What are the causes? This condition may be caused by:  Pollen from grasses, trees, and weeds.  Dust mites.  Smoke.  Mold.  Car fumes.  The pee (urine), spit, or dander of pets. Dander is dead skin cells from a pet.   What increases the risk? You are more likely to develop this condition if:  You have allergies in your family.  You have problems like allergies in your family. You may have: ? Swelling of parts of your eyes and eyelids. ? Asthma. This affects how you breathe. ? Long-term redness and swelling on your skin. ? Food allergies. What are the signs or symptoms? The main symptom of this condition is a runny or stuffy nose (nasal congestion). Other symptoms may include:  Sneezing or coughing.  Itching and tearing of your eyes.  Mucus that drips down the back of your throat (postnasal drip).  Trouble sleeping.  Feeling tired.  Headache.  Sore  throat. How is this treated? There is no cure for this condition. You should avoid things that you are allergic to. Treatment can help to relieve symptoms. This may include:  Medicines that block allergy symptoms, such as corticosteroids or antihistamines. These may be given as a shot, nasal spray, or pill.  Avoiding things you are allergic to.  Medicines that give you bits of what you are allergic to over time. This is called immunotherapy. It is done if other treatments do not help. You may get: ? Shots. ? Medicine under your tongue.  Stronger medicines, if other treatments do not help. Follow these instructions at home: Avoiding allergens Find out what things you are allergic to and avoid them. To do this, try these things:  If you get allergies any time of year: ? Replace carpet with wood, tile, or vinyl flooring. Carpet can trap pet dander and dust. ? Do not smoke. Do not allow smoking in your home. ? Change your heating and air conditioning filters at least once a month.  If you get allergies only some times of the year: ? Keep windows closed when you can. ? Plan things to do outside when pollen counts are lowest. Check pollen counts before you plan things to do outside. ? When you come indoors, change your clothes and shower before you sit on furniture or bedding.   If you are allergic to a pet: ? Keep the pet out of your bedroom. ?  Vacuum, sweep, and dust often.   General instructions  Take over-the-counter and prescription medicines only as told by your doctor.  Drink enough fluid to keep your pee (urine) pale yellow.  Keep all follow-up visits as told by your doctor. This is important. Where to find more information  American Academy of Allergy, Asthma & Immunology: www.aaaai.org Contact a doctor if:  You have a fever.  You get a cough that does not go away.  You make whistling sounds when you breathe (wheeze).  Your symptoms slow you down.  Your symptoms  stop you from doing your normal things each day. Get help right away if:  You are short of breath. This symptom may be an emergency. Do not wait to see if the symptom will go away. Get medical help right away. Call your local emergency services (911 in the [place] Do not drive yourself to the hospital. Summary  Allergic rhinitis may be treated by taking medicines and avoiding things you are allergic to.  If you have allergies only some of the year, keep windows closed when you can at those times.  Contact your doctor if you get a fever or a cough that does not go away. This information is not intended to replace advice given to you by your health care provider. Make sure you discuss any questions you have with your health care provider. Document Revised: 06/24/2019 Document Reviewed: 04/30/2019 Elsevier Patient Education  2021 [employer].

## 2020-09-02 NOTE — Progress Notes (Signed)
Subjective:    Patient ID: Kathleen Spears, female    DOB: [birth year], 25 y.o.   MRN: [id number]  This is 25 year old female seen in the mobile medicine unit for evaluation of cold-like symptoms.  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  On arrival COVID antigen test was performed and was negative.  Patient denies any gastrointestinal symptoms does have mild headache no real fever  Of note the patient is interested in achieving primary care    Past Medical History:  Diagnosis Date  . Anxiety attack   . Asthma   . Herpes   . Migraines   . Sickle-cell trait (HCC)      Family History  Problem Relation Age of Onset  . Bone cancer Mother      Social History   Socioeconomic History  . Marital status: Single    Spouse name: Not on file  . Number of children: Not on file  . Years of education: Not on file  . Highest education level: Not on file  Occupational History  . Not on file  Tobacco Use  . Smoking status: Never Smoker  . Smokeless tobacco: Never Used  Substance and Sexual Activity  . Alcohol use: No    Alcohol/week: 0.0 standard drinks    Comment: social  . Drug use: No  . Sexual activity: Yes    Partners: Male  Other Topics Concern  . Not on file  Social History Narrative  . Not on file   Social Determinants of Health   Financial Resource Strain: Not on file  Food Insecurity: Not on file  Transportation Needs: Not on file  Physical Activity: Not on file  Stress: Not on file  Social Connections: Not on file  Intimate Partner Violence: Not on file     Allergies  Allergen Reactions  . Hydrocodone Nausea And Vomiting     Outpatient Medications Prior to Visit  Medication Sig Dispense Refill  . albuterol (VENTOLIN HFA) 108 (90 Base) MCG/ACT inhaler INHALE 2 PUFFS INTO THE LUNGS EVERY 4 HOURS AS NEEDED FOR WHEEZING OR  SHORTNESS OF BREATH 6.7 g 0  . escitalopram (LEXAPRO) 10 MG tablet Take 10 mg by mouth daily.    [name] etonogestrel (NEXPLANON) 68 MG IMPL implant Nexplanon 68 mg subdermal implant  1 [facility]    . ibuprofen (ADVIL) 600 MG tablet Take 1 tablet (600 mg total) by mouth every 8 (eight) hours as needed for moderate pain. (Patient not taking: Reported on 09/02/2020) 30 tablet 0  . norethindrone (AYGESTIN) 5 MG tablet Take 5 mg by mouth daily. (Patient not taking: Reported on 09/02/2020)    . pantoprazole (PROTONIX) 40 MG tablet Take 1 tablet (40 mg total) by mouth daily. (Patient not taking: Reported on 09/02/2020) 30 tablet 3  . Prenatal MV-Min-FA-Omega-3 (PRENATAL GUMMIES/DHA & FA) 0.4-32.5 MG CHEW Chew 2 each by mouth daily. (Patient not taking: Reported on 09/02/2020)     No facility-administered medications prior to visit.    Review of Systems  Constitutional: Negative.   HENT: Positive for postnasal drip, rhinorrhea, sneezing and sore throat. Negative for ear discharge, ear pain, facial swelling, hearing loss, nosebleeds, sinus pressure, sinus pain, tinnitus, trouble swallowing and voice change.   Respiratory: Positive for shortness of breath. Negative for cough, choking, chest tightness, wheezing and stridor.  Cardiovascular: Negative.   Genitourinary: Negative.   Musculoskeletal: Negative.   Neurological: Positive for headaches.  Psychiatric/Behavioral: Negative.        Objective:   Physical Exam Vitals:   09/02/20 1059  BP: 113/70  Pulse: 82  Resp: 18  Temp: 98.2 F (36.8 C)  TempSrc: Oral  SpO2: 100%  Weight: 130 lb (59 kg)  Height: 5' 2.5" (1.588 m)    Gen: Pleasant, well-nourished, in no distress,  normal affect  ENT: No lesions,  mouth clear,  oropharynx clear, 3+ postnasal drip, turbinate edema without purulence throat is mildly erythematous but no evidence of strep throat no evidence of acute infection or pharyngitis  Neck: No JVD, no  TMG, no carotid bruits  Lungs: No use of accessory muscles, no dullness to percussion, clear without rales or rhonchi  Cardiovascular: RRR, heart sounds normal, no murmur or gallops, no peripheral edema  Abdomen: soft and NT, no HSM,  BS normal  Musculoskeletal: No deformities, no cyanosis or clubbing  Neuro: alert, non focal  Skin: Warm, no lesions or rashes      Assessment & Plan:  I personally reviewed all images and lab data in the [facility] system as well as any outside material available during this office visit and agree with the  radiology impressions.   Asthma No evidence of acute flare monitor  Sore throat Appears to be either a mild case of viral pharyngitis or allergic rhinitis we will treat with Flonase and oral antihistamine     Kathleen Spears was seen today for uri.  Diagnoses and all orders for this visit:  Sore throat -     POC COVID-19  Allergic rhinitis with postnasal drip  Mild intermittent asthma without complication  Other orders -     fluticasone (FLONASE) 50 MCG/ACT nasal spray; Place 2 sprays into both nostrils daily. -     cetirizine (ZYRTEC) 10 MG tablet; Take 1 tablet (10 mg total) by mouth daily.  We will work on getting this patient into the health and wellness clinic

## 2020-09-04 ENCOUNTER — Encounter (HOSPITAL_COMMUNITY): Payer: Self-pay | Admitting: Emergency Medicine

## 2020-09-04 ENCOUNTER — Ambulatory Visit (HOSPITAL_COMMUNITY)
Admission: EM | Admit: 2020-09-04 | Discharge: 2020-09-04 | Disposition: A | Discharge status: Home / Self Care | Payer: BC Managed Care – PPO | Attending: Internal Medicine | Admitting: Internal Medicine

## 2020-09-04 ENCOUNTER — Other Ambulatory Visit: Payer: Self-pay

## 2020-09-04 DIAGNOSIS — R1084 Generalized abdominal pain: Secondary | ICD-10-CM

## 2020-09-04 DIAGNOSIS — R110 Nausea: Secondary | ICD-10-CM | POA: Diagnosis not present

## 2020-09-04 LAB — COMPREHENSIVE METABOLIC PANEL
ALT: 14 U/L (ref 0–44)
AST: 19 U/L (ref 15–41)
Albumin: 4.2 g/dL (ref 3.5–5.0)
Alkaline Phosphatase: 49 U/L (ref 38–126)
Anion gap: 6 (ref 5–15)
BUN: 7 mg/dL (ref 6–20)
CO2: 24 mmol/L (ref 22–32)
Calcium: 9 mg/dL (ref 8.9–10.3)
Chloride: 106 mmol/L (ref 98–111)
Creatinine, Ser: 0.67 mg/dL (ref 0.44–1.00)
GFR, Estimated: >60 mL/min (ref >60)
Glucose, Bld: 98 mg/dL (ref 70–99)
Potassium: 4.1 mmol/L (ref 3.5–5.1)
Sodium: 136 mmol/L (ref 135–145)
Total Bilirubin: 0.6 mg/dL (ref 0.3–1.2)
Total Protein: 7.4 g/dL (ref 6.5–8.1)

## 2020-09-04 LAB — POCT URINALYSIS DIPSTICK, ED / UC
Bilirubin Urine: NEGATIVE
Glucose, UA: NEGATIVE mg/dL
Ketones, ur: NEGATIVE mg/dL
Leukocytes,Ua: NEGATIVE
Nitrite: NEGATIVE
Protein, ur: NEGATIVE mg/dL
Specific Gravity, Urine: 1.02 (ref 1.005–1.030)
Urobilinogen, UA: 1 mg/dL (ref 0.0–1.0)
pH: 7.5 (ref 5.0–8.0)

## 2020-09-04 LAB — CBC WITH DIFFERENTIAL/PLATELET
Abs Immature Granulocytes: 0.03 10*3/uL (ref 0.00–0.07)
Basophils Absolute: 0 10*3/uL (ref 0.0–0.1)
Basophils Relative: 0 %
Eosinophils Absolute: 0.2 10*3/uL (ref 0.0–0.5)
Eosinophils Relative: 2 %
HCT: 40.6 % (ref 36.0–46.0)
Hemoglobin: 13.8 g/dL (ref 12.0–15.0)
Immature Granulocytes: 0 %
Lymphocytes Relative: 10 %
Lymphs Abs: 1 10*3/uL (ref 0.7–4.0)
MCH: 28.2 pg (ref 26.0–34.0)
MCHC: 34 g/dL (ref 30.0–36.0)
MCV: 82.9 fL (ref 80.0–100.0)
Monocytes Absolute: 0.7 10*3/uL (ref 0.1–1.0)
Monocytes Relative: 7 %
Neutro Abs: 8.3 10*3/uL — ABNORMAL HIGH (ref 1.7–7.7)
Neutrophils Relative %: 81 %
Platelets: 222 10*3/uL (ref 150–400)
RBC: 4.9 MIL/uL (ref 3.87–5.11)
RDW: 13.2 % (ref 11.5–15.5)
WBC: 10.2 10*3/uL (ref 4.0–10.5)
nRBC: 0 % (ref 0.0–0.2)

## 2020-09-04 LAB — POC URINE PREG, ED: Preg Test, Ur: NEGATIVE

## 2020-09-04 LAB — LIPASE, BLOOD: Lipase: 43 U/L (ref 11–51)

## 2020-09-04 MED ORDER — ONDANSETRON 4 MG PO TBDP: 4.0000 mg | ORAL_TABLET | Freq: Three times a day (TID) | ORAL | 0 refills | Status: DC | PRN

## 2020-09-04 NOTE — Discharge Instructions (Signed)
Use Zofran for nausea relief.  Make sure you are drinking plenty of fluid and eat a bland diet such as brat diet (bananas, rice, applesauce, toast).  We will be in touch with your lab results as soon as we have them.  If you have any persistent or worsening abdominal pain you need to go to the hospital as we discussed.

## 2020-09-04 NOTE — ED Provider Notes (Signed)
[facility]    CSN: [id number] Arrival date & time: 09/04/20  1023      History   Chief Complaint Chief Complaint  Patient presents with  . Abdominal Pain  . Nausea  . Emesis  . Diarrhea    HPI Kathleen Spears is a 25 y.o. female.   Patient presents today with a 1 week history of epigastric abdominal pain.  She reports 24 hours of nausea/vomiting and diarrhea.  She describes diarrhea as 2-3 watery bowel movements without blood or mucus.  Had 1 episode of emesis earlier today that was described as stomach contents.  Pain is rated 10 on a 0-10 pain scale, described as cramping, no aggravating or alleviating factors identified.  Patient denies any relationship with food.  She denies any medication changes, suspicious food intake, recent travel, recent antibiotic use.  Denies any known sick contacts.  She was evaluated by nurse at work and tested negative for COVID-19 and flu.  She did recently have Nexplanon placed within the past several months and has had intermittent spotting since that time.  She wonders if Nexplanon could be contributing to symptoms.  Denies history of gastrointestinal disorder or previous abdominal surgeries.  She has not tried any over-the-counter medications for symptom management.     Past Medical History:  Diagnosis Date  . Anxiety attack   . Asthma   . Herpes   . Migraines   . Sickle-cell trait [facility])     Patient Active Problem List   Diagnosis Date Noted  . Sore throat 09/02/2020  . Sickle cell trait (HCC) 07/31/2019  . GERD (gastroesophageal reflux disease) 07/31/2019  . Anxiety 06/19/2019  . Asthma 07/08/2015  . Migraine without aura and without status migrainosus, not intractable 05/02/2014    Past Surgical History:  Procedure Laterality Date  . TONSILLECTOMY    . WISDOM TOOTH EXTRACTION      OB History    Gravida  1   Para  1   Term  1   Preterm      AB      Living  1     SAB      IAB      Ectopic       Multiple  0   Live Births  1            Home Medications    Prior to Admission medications   Medication Sig Start Date End Date Taking? Authorizing Provider  albuterol (VENTOLIN HFA) 108 (90 Base) MCG/ACT inhaler INHALE 2 PUFFS INTO THE LUNGS EVERY 4 HOURS AS NEEDED FOR WHEEZING OR SHORTNESS OF BREATH 06/08/20  Yes Storm Frisk, MD  cetirizine (ZYRTEC) 10 MG tablet Take 1 tablet (10 mg total) by mouth daily. 09/02/20  Yes Storm Frisk, MD  escitalopram (LEXAPRO) 10 MG tablet Take 10 mg by mouth daily. 07/23/19  Yes [provider]  etonogestrel (NEXPLANON) 68 MG IMPL implant Nexplanon 68 mg subdermal implant  1 [facility]   Yes [provider]  fluticasone (FLONASE) 50 MCG/ACT nasal spray Place 2 sprays into both nostrils daily. 09/02/20  Yes Storm Frisk, MD  ondansetron (ZOFRAN ODT) 4 MG disintegrating tablet Take 1 tablet (4 mg total) by mouth every 8 (eight) hours as needed for nausea or vomiting. 09/04/20  Yes Rafal Archuleta, Noberto Retort, PA-C    Family History Family History  Problem Relation Age of Onset  . Bone cancer Mother  Social History Social History   Tobacco Use  . Smoking status: Never Smoker  . Smokeless tobacco: Never Used  Vaping Use  . Vaping Use: Never used  Substance Use Topics  . Alcohol use: No    Alcohol/week: 0.0 standard drinks    Comment: social  . Drug use: No     Allergies   Hydrocodone   Review of Systems Review of Systems  Constitutional: Positive for activity change. Negative for appetite change, fatigue and fever.  Respiratory: Negative for cough and shortness of breath.   Cardiovascular: Negative for chest pain.  Gastrointestinal: Positive for abdominal pain, diarrhea, nausea and vomiting. Negative for blood in stool and constipation.  Genitourinary: Negative for dysuria, frequency, urgency, vaginal bleeding, vaginal discharge and vaginal pain.  Musculoskeletal: Negative for  arthralgias and myalgias.  Neurological: Positive for headaches (chronic). Negative for dizziness, weakness and light-headedness.     Physical Exam Triage Vital Signs ED Triage Vitals  Enc Vitals Group     BP 09/04/20 1053 102/62     Pulse Rate 09/04/20 1053 87     Resp 09/04/20 1053 16     Temp 09/04/20 1053 98 F (36.7 C)     Temp Source 09/04/20 1053 Oral     SpO2 09/04/20 1053 100 %     Weight --      Height --      Head Circumference --      Peak Flow --      Pain Score 09/04/20 1050 10     Pain Loc --      Pain Edu? --      Excl. in GC? --    No data found.  Updated Vital Signs BP 102/62 (BP Location: Right Arm)   Pulse 87   Temp 98 F (36.7 C) (Oral)   Resp 16   LMP 08/12/2020 (Approximate)   SpO2 100%   Visual Acuity Right Eye Distance:   Left Eye Distance:   Bilateral Distance:    Right Eye Near:   Left Eye Near:    Bilateral Near:     Physical Exam Vitals reviewed.  Constitutional:      General: She is awake. She is not in acute distress.    Appearance: Normal appearance. She is not ill-appearing.     Comments: Very pleasant female appears stated age in no acute distress  HENT:     Head: Normocephalic and atraumatic.     Mouth/Throat:     Mouth: Mucous membranes are moist.  Cardiovascular:     Rate and Rhythm: Normal rate and regular rhythm.     Heart sounds: No murmur heard.   Pulmonary:     Effort: Pulmonary effort is normal.     Breath sounds: Normal breath sounds. No wheezing, rhonchi or rales.     Comments: Clear to auscultation bilaterally Abdominal:     General: Bowel sounds are normal.     Palpations: Abdomen is soft.     Tenderness: There is no abdominal tenderness. There is no right CVA tenderness, left CVA tenderness, guarding or rebound.     Comments: Benign abdominal exam; no tenderness to palpation.  Psychiatric:        Behavior: Behavior is cooperative.      UC Treatments / Results  Labs (all labs ordered are  listed, but only abnormal results are displayed) Labs Reviewed  POCT URINALYSIS DIPSTICK, ED / UC - Abnormal; Notable for the following components:      Result Value  Hgb urine dipstick SMALL (*)    All other components within normal limits  URINE CULTURE  CBC WITH DIFFERENTIAL/PLATELET  COMPREHENSIVE METABOLIC PANEL  LIPASE, BLOOD  POC URINE PREG, ED    EKG   Radiology No results found.  Procedures Procedures (including critical care time)  Medications Ordered in UC Medications - No data to display  Initial Impression / Assessment and Plan / UC Course  I have reviewed the triage vital signs and the nursing notes.  Pertinent labs & imaging results that were available during my care of the patient were reviewed by me and considered in my medical decision making (see chart for details).     Vital signs and physical exam reassuring today; no indication for emergent evaluation or imaging.  Urine pregnancy was negative in office.  UA showed hemoglobin likely related to vaginal bleeding.  Will obtain urine culture to rule out infection but will defer antibiotics for the time being.  CBC, CMP, lipase obtained today-results pending.  Patient was prescribed Zofran to be used as needed for nausea.  Recommended she drink plenty of fluid and eat a bland diet.  Discussed that we are unable to order imaging of abdomen and if she has any persistent/worsening abdominal pain she needs to go to the ER for further evaluation to which she expressed understanding.  Strict return precautions given to which patient expressed understanding.  Final Clinical Impressions(s) / UC Diagnoses   Final diagnoses:  None     Discharge Instructions     Use Zofran for nausea relief.  Make sure you are drinking plenty of fluid and eat a bland diet such as brat diet (bananas, rice, applesauce, toast).  We will be in touch with your lab results as soon as we have them.  If you have any persistent or worsening  abdominal pain you need to go to the hospital as we discussed.    ED Prescriptions    Medication Sig Dispense Auth. Provider   ondansetron (ZOFRAN ODT) 4 MG disintegrating tablet Take 1 tablet (4 mg total) by mouth every 8 (eight) hours as needed for nausea or vomiting. 20 tablet Horrace Hanak, Noberto Retort, PA-C     PDMP not reviewed this encounter.   Jeani Hawking, PA-C 09/04/20 1143

## 2020-09-04 NOTE — ED Triage Notes (Signed)
Pt presents today with c/o of abdominal pain x1 week. She had one episode of vomit today. +n/d.

## 2020-09-05 LAB — URINE CULTURE: Culture: NO GROWTH

## 2020-09-06 ENCOUNTER — Emergency Department (HOSPITAL_COMMUNITY)
Admission: EM | Admit: 2020-09-06 | Discharge: 2020-09-06 | Disposition: A | Discharge status: Home / Self Care | Payer: BC Managed Care – PPO | Attending: Emergency Medicine | Admitting: Emergency Medicine

## 2020-09-06 ENCOUNTER — Encounter (HOSPITAL_COMMUNITY): Payer: Self-pay | Admitting: Emergency Medicine

## 2020-09-06 ENCOUNTER — Emergency Department (HOSPITAL_COMMUNITY): Payer: BC Managed Care – PPO

## 2020-09-06 DIAGNOSIS — N760 Acute vaginitis: Secondary | ICD-10-CM | POA: Diagnosis not present

## 2020-09-06 DIAGNOSIS — B9689 Other specified bacterial agents as the cause of diseases classified elsewhere: Secondary | ICD-10-CM | POA: Diagnosis not present

## 2020-09-06 DIAGNOSIS — N898 Other specified noninflammatory disorders of vagina: Secondary | ICD-10-CM | POA: Diagnosis present

## 2020-09-06 DIAGNOSIS — J45909 Unspecified asthma, uncomplicated: Secondary | ICD-10-CM | POA: Diagnosis not present

## 2020-09-06 DIAGNOSIS — R112 Nausea with vomiting, unspecified: Secondary | ICD-10-CM | POA: Diagnosis not present

## 2020-09-06 DIAGNOSIS — R0602 Shortness of breath: Secondary | ICD-10-CM

## 2020-09-06 DIAGNOSIS — Z20822 Contact with and (suspected) exposure to covid-19: Secondary | ICD-10-CM | POA: Diagnosis not present

## 2020-09-06 DIAGNOSIS — R1084 Generalized abdominal pain: Secondary | ICD-10-CM

## 2020-09-06 LAB — WET PREP, GENITAL
Sperm: NONE SEEN
Trich, Wet Prep: NONE SEEN
Yeast Wet Prep HPF POC: NONE SEEN

## 2020-09-06 LAB — COMPREHENSIVE METABOLIC PANEL
ALT: 11 U/L (ref 0–44)
AST: 16 U/L (ref 15–41)
Albumin: 4.3 g/dL (ref 3.5–5.0)
Alkaline Phosphatase: 47 U/L (ref 38–126)
Anion gap: 9 (ref 5–15)
BUN: 6 mg/dL (ref 6–20)
CO2: 22 mmol/L (ref 22–32)
Calcium: 9.2 mg/dL (ref 8.9–10.3)
Chloride: 104 mmol/L (ref 98–111)
Creatinine, Ser: 0.74 mg/dL (ref 0.44–1.00)
GFR, Estimated: >60 mL/min (ref >60)
Glucose, Bld: 89 mg/dL (ref 70–99)
Potassium: 3.7 mmol/L (ref 3.5–5.1)
Sodium: 135 mmol/L (ref 135–145)
Total Bilirubin: 0.7 mg/dL (ref 0.3–1.2)
Total Protein: 7.3 g/dL (ref 6.5–8.1)

## 2020-09-06 LAB — CBC
HCT: 42.3 % (ref 36.0–46.0)
Hemoglobin: 14.3 g/dL (ref 12.0–15.0)
MCH: 28.2 pg (ref 26.0–34.0)
MCHC: 33.8 g/dL (ref 30.0–36.0)
MCV: 83.4 fL (ref 80.0–100.0)
Platelets: 236 10*3/uL (ref 150–400)
RBC: 5.07 MIL/uL (ref 3.87–5.11)
RDW: 12.8 % (ref 11.5–15.5)
WBC: 4.6 10*3/uL (ref 4.0–10.5)
nRBC: 0 % (ref 0.0–0.2)

## 2020-09-06 LAB — URINALYSIS, ROUTINE W REFLEX MICROSCOPIC
Bilirubin Urine: NEGATIVE
Glucose, UA: NEGATIVE mg/dL
Ketones, ur: NEGATIVE mg/dL
Leukocytes,Ua: NEGATIVE
Nitrite: NEGATIVE
Protein, ur: NEGATIVE mg/dL
Specific Gravity, Urine: 1.016 (ref 1.005–1.030)
pH: 5 (ref 5.0–8.0)

## 2020-09-06 LAB — I-STAT BETA HCG BLOOD, ED (MC, WL, AP ONLY): I-stat hCG, quantitative: <5 m[IU]/mL (ref <5)

## 2020-09-06 LAB — SARS CORONAVIRUS 2 (TAT 6-24 HRS): SARS Coronavirus 2: NEGATIVE

## 2020-09-06 LAB — LIPASE, BLOOD: Lipase: 37 U/L (ref 11–51)

## 2020-09-06 MED ORDER — DICYCLOMINE HCL 20 MG PO TABS: 20.0000 mg | ORAL_TABLET | Freq: Two times a day (BID) | ORAL | 0 refills | Status: DC

## 2020-09-06 MED ORDER — KETOROLAC TROMETHAMINE 30 MG/ML IJ SOLN
30.0000 mg | Freq: Once | INTRAMUSCULAR | Status: AC
Start: 2020-09-06 — End: 2020-09-06
  Administered 2020-09-06: 30 mg via INTRAVENOUS
  Filled 2020-09-06: qty 1

## 2020-09-06 MED ORDER — ONDANSETRON HCL 4 MG/2ML IJ SOLN
4.0000 mg | Freq: Once | INTRAMUSCULAR | Status: AC
  Administered 2020-09-06: 4 mg via INTRAVENOUS
  Filled 2020-09-06: qty 2

## 2020-09-06 MED ORDER — METRONIDAZOLE 500 MG PO TABS: 500.0000 mg | ORAL_TABLET | Freq: Two times a day (BID) | ORAL | 0 refills | Status: DC

## 2020-09-06 MED ORDER — SODIUM CHLORIDE 0.9 % IV BOLUS
1000.0000 mL | Freq: Once | INTRAVENOUS | Status: AC
  Administered 2020-09-06: 1000 mL via INTRAVENOUS

## 2020-09-06 NOTE — ED Triage Notes (Addendum)
Pt reports mid upper abd pain, nausea, vomiting, and diarrhea that started on Friday.  States n/v/d has resolved but continues to have pain.

## 2020-09-06 NOTE — ED Provider Notes (Signed)
[facility] EMERGENCY DEPARTMENT Provider Note   CSN: [id number] Arrival date & time: 09/06/20  1225     History No chief complaint on file.   Kathleen Spears is a 25 y.o. female who presents to the ED today with complaints of gradual onset, constant, worsening, diffuse abdominal pain that began 2 days ago.  Also complains of initial nausea, nonbloody nonbilious emesis, watery diarrhea.  She went to urgent care on the same day and had lab work done without any acute findings.  She was also complaining of some vaginal bleeding/vaginal discharge at that time.  Here she had Nexplanon placed at the beginning of March and has had intermittent spotting since then per their note.  She was prescribed Zofran as needed for nausea and advised to go to the ED if any worsening symptoms.  Patient reports that since Friday she has not had any more vomiting or diarrhea however continues to have abdominal pain.  She states she now feels short of breath that she believes is related to the pain she is not.  She denies any chest pain or coughing.  She does mention that she had some issues with nasal congestion about a week ago and tested negative for COVID at that time.  She continues to have some vaginal discharge.  She denies any obvious pelvic pain.  She is sexually active with 1 female partner however denies any concern for STIs at this time.  She denies any previous history of abdominal surgeries.  She denies any recent antibiotic use.  No heavy alcohol use.  No suspicious food intake or recent sick contacts.    The history is provided by the patient and medical records.       Past Medical History:  Diagnosis Date  . Anxiety attack   . Asthma   . Herpes   . Migraines   . Sickle-cell trait [facility])     Patient Active Problem List   Diagnosis Date Noted  . Sore throat 09/02/2020  . Sickle cell trait (HCC) 07/31/2019  . GERD (gastroesophageal reflux disease) 07/31/2019  . Anxiety  06/19/2019  . Asthma 07/08/2015  . Migraine without aura and without status migrainosus, not intractable 05/02/2014    Past Surgical History:  Procedure Laterality Date  . TONSILLECTOMY    . WISDOM TOOTH EXTRACTION       OB History    Gravida  1   Para  1   Term  1   Preterm      AB      Living  1     SAB      IAB      Ectopic      Multiple  0   Live Births  1           Family History  Problem Relation Age of Onset  . Bone cancer Mother     Social History   Tobacco Use  . Smoking status: Never Smoker  . Smokeless tobacco: Never Used  Vaping Use  . Vaping Use: Never used  Substance Use Topics  . Alcohol use: No    Alcohol/week: 0.0 standard drinks    Comment: social  . Drug use: No    Home Medications Prior to Admission medications   Medication Sig Start Date End Date Taking? Authorizing Provider  dicyclomine (BENTYL) 20 MG tablet Take 1 tablet (20 mg total) by mouth 2 (two) times daily. 09/06/20  Yes Richa Shor, PA-C  metroNIDAZOLE (FLAGYL) 500  MG tablet Take 1 tablet (500 mg total) by mouth 2 (two) times daily. 09/06/20  Yes Malacki Mcphearson, PA-C  albuterol (VENTOLIN HFA) 108 (90 Base) MCG/ACT inhaler INHALE 2 PUFFS INTO THE LUNGS EVERY 4 HOURS AS NEEDED FOR WHEEZING OR SHORTNESS OF BREATH 06/08/20   Storm FriskWright, Patrick E, MD  cetirizine (ZYRTEC) 10 MG tablet Take 1 tablet (10 mg total) by mouth daily. 09/02/20   Storm FriskWright, Patrick E, MD  escitalopram (LEXAPRO) 10 MG tablet Take 10 mg by mouth daily. 07/23/19   [provider]  etonogestrel (NEXPLANON) 68 MG IMPL implant Nexplanon 68 mg subdermal implant  1 [facility]    [provider]  fluticasone (FLONASE) 50 MCG/ACT nasal spray Place 2 sprays into both nostrils daily. 09/02/20   Storm FriskWright, Patrick E, MD  ondansetron (ZOFRAN ODT) 4 MG disintegrating tablet Take 1 tablet (4 mg total) by mouth every 8 (eight) hours as needed for nausea or vomiting. 09/04/20    Raspet, Noberto RetortErin K, PA-C    Allergies    Hydrocodone  Review of Systems   Review of Systems  Constitutional: Negative for chills and fever.  Respiratory: Positive for shortness of breath. Negative for cough.   Cardiovascular: Negative for chest pain, palpitations and leg swelling.  Gastrointestinal: Positive for abdominal pain, diarrhea (resolved), nausea and vomiting (resolved).  Genitourinary: Positive for vaginal discharge. Negative for dysuria, flank pain, frequency, hematuria and pelvic pain.  All other systems reviewed and are negative.   Physical Exam Updated Vital Signs BP 111/76 (BP Location: Left Arm)   Pulse 78   Temp 98.2 F (36.8 C) (Oral)   Resp 18   LMP 08/12/2020 (Approximate)   SpO2 98%   Physical Exam Vitals and nursing note reviewed.  Constitutional:      Appearance: She is not ill-appearing or diaphoretic.  HENT:     Head: Normocephalic and atraumatic.     Mouth/Throat:     Mouth: Mucous membranes are dry.  Eyes:     Conjunctiva/sclera: Conjunctivae normal.  Cardiovascular:     Rate and Rhythm: Normal rate and regular rhythm.     Pulses: Normal pulses.  Pulmonary:     Effort: Pulmonary effort is normal.     Breath sounds: Normal breath sounds. No wheezing, rhonchi or rales.  Abdominal:     Palpations: Abdomen is soft.     Tenderness: There is abdominal tenderness. There is no right CVA tenderness, left CVA tenderness, guarding or rebound.     Comments: Soft, diffuse mild abdominal TTP, +BS throughout, no r/g/r, neg murphy's, neg mcburney's, no CVA TTP  Genitourinary:    Comments: Chaperone present for exam. No rashes, lesions, or tenderness to external genitalia. No erythema, injury, or tenderness to vaginal mucosa. Mild brown vaginal discharge in vault likely from menstrual cycle. No adnexal masses, tenderness, or fullness. No CMT, cervical friability, or discharge from cervical os. Cervical os is closed. Uterus non-deviated, mobile, nonTTP, and  without enlargement.  Musculoskeletal:     Cervical back: Neck supple.  Skin:    General: Skin is warm and dry.  Neurological:     Mental Status: She is alert.     ED Results / Procedures / Treatments   Labs (all labs ordered are listed, but only abnormal results are displayed) Labs Reviewed  WET PREP, GENITAL - Abnormal; Notable for the following components:      Result Value   Clue Cells Wet Prep HPF POC PRESENT (*)  WBC, Wet Prep HPF POC MANY (*)    All other components within normal limits  SARS CORONAVIRUS 2 (TAT 6-24 HRS)  LIPASE, BLOOD  COMPREHENSIVE METABOLIC PANEL  CBC  URINALYSIS, ROUTINE W REFLEX MICROSCOPIC  I-STAT BETA HCG BLOOD, ED ([facility], [facility], [facility] ONLY)  GC/CHLAMYDIA PROBE AMP ([place]) NOT AT [facility]    EKG EKG Interpretation  Date/Time:  Sunday September 06 2020 12:38:33 EDT Ventricular Rate:  71 PR Interval:  152 QRS Duration: 76 QT Interval:  366 QTC Calculation: 397 R Axis:   86 Text Interpretation: Normal sinus rhythm Normal ECG No significant change since last tracing Confirmed by Knapp, Jon ([id number]) on 09/06/2020 1:26:46 PM   Radiology DG Chest Port 1 View  Result Date: 09/06/2020 CLINICAL DATA:  Shortness of breath EXAM: PORTABLE CHEST 1 VIEW COMPARISON:  11/21/2019 FINDINGS: The heart size and mediastinal contours are within normal limits. Both lungs are clear. The visualized skeletal structures are unremarkable. IMPRESSION: No active disease. Electronically Signed   By: Nicholas  Plundo D.O.   On: 09/06/2020 15:27    Procedures Procedures   Medications Ordered in ED Medications  sodium chloride 0.9 % bolus 1,000 mL (1,000 mLs Intravenous New Bag/Given 09/06/20 1403)  ondansetron (ZOFRAN) injection 4 mg (4 mg Intravenous Given 09/06/20 1403)  ketorolac (TORADOL) 30 MG/ML injection 30 mg (30 mg Intravenous Given 09/06/20 1541)    ED Course  I have reviewed the triage vital signs and the nursing notes.  Pertinent labs & imaging results that were  available during my care of the patient were reviewed by me and considered in my medical decision making (see chart for details).  Clinical Course as of 09/06/20 1543  Sun Sep 06, 2020  1525 Clue Cells Wet Prep HPF POC(!): PRESENT [MV]    Clinical Course User Index [MV] Vylette Strubel, PA-C   MDM Rules/Calculators/A&P                          24  year old female presents to the ED today complaining of diffuse abdominal pain, nausea, vomiting, diarrhea as well as vaginal discharge and new shortness of breath that she relates to her pain for the past 2 days.  Initially seen at urgent care with negative work-up including lab work.  Was advised to come to the ED for worsening symptoms.  Her vomiting and diarrhea have resolved however she continues to have pain.  Arrival to the ED vitals are stable.  Patient appears to be in no acute distress.  On exam she has mild diffuse abdominal tenderness to palpation no rebound or guarding.  Very low suspicion for acute abdomen at this time.  She denies any specific pelvic pain, has been having some light vaginal spotting since having her Nexplanon placed at the beginning of March.  He is unsure regarding her last menstrual cycle.  She is sexually active with 1 female partner.  She denies any cough or chest pain.  No other complaints at this time.  We will plan to work-up with repeat labs pelvic exam today.  EKG was obtained due to patient's complaint of shortness of breath which does not show any acute ischemic changes.  We will plan for chest x-ray as well.  Will swab for COVID at this time.  Patient was tested recently and came back negative however given constellation of symptoms of GI symptoms as well as shortness of breath cannot rule this out today.  Question if her vaginal spotting is  unrelated.  Lab work unremarkable at this time.  CBC without leukocytosis and hemoglobin stable at 14.3. CMP for any electrolyte abnormalities.  LFTs unremarkable.  Creatinine  0.74. Lipase 37. hcg negative  Pelvic exam performed.  She does have some brown vaginal discharge in vault that I suspect is secondary to menstruation. She reports on and off vaginal bleeding since last normal menstrual period on 03/19; she had nexplanon placed at the beginning of March. Suspect her body is still getting used to the hormones. She does not have any adnexal or cervical motion tenderness on my exam.  Will await wet prep results at this time.   Wet prep positive for BV at this time. Will treat.  CXR clear.   On reevaluation pt resting comfortably; reports she now has a mild headache as well. Will provide Toradol for headache. No focal deficits and no concern for meningitis today.   Workup overall reassuring. I again suspect a viral process for her abdominal pain, nausea, vomiting, and diarrhea. Will provide bentyl prescription. Pt already has zofran at home. Pt instructed to take medication as needed and to drink plenty of fluids to stay hydrated. She is instructed to await her gonorrhea and chlamydia results prior to engaging in intercourse. She is also instructed to self isolate until she receives her COVID test results. Pt in agreement with plan. She has not urinated in the ED today - will await U/A and reevaluate after toradol and plan to discharge home.   At shift change case signed out to oncoming provider Rhea Bleacher, PA-C, who will dispo patient accordingly.   This note was prepared using Dragon voice recognition software and may include unintentional dictation errors due to the inherent limitations of voice recognition software.  Final Clinical Impression(s) / ED Diagnoses Final diagnoses:  Nausea vomiting and diarrhea  Generalized abdominal pain  Vaginal discharge  Bacterial vaginosis  Shortness of breath    Rx / DC Orders ED Discharge Orders         Ordered    metroNIDAZOLE (FLAGYL) 500 MG tablet  2 times daily        09/06/20 1541    dicyclomine (BENTYL) 20 MG  tablet  2 times daily        09/06/20 1541           Tanda Rockers, PA-C 09/06/20 1544    Linwood Dibbles, MD 09/08/20 [phone]

## 2020-09-06 NOTE — Discharge Instructions (Addendum)
Your workup was overall reassuring in the ED today. Your chest xray did not show any signs of pneumonia or other abnormalities. Your pelvic exam did come back positive for bacterial vaginosis.   Please pick up medications and take as prescribed to cover for bacterial vaginosis. We have also tested you for gonorrhea and chlamydia and will call you in 2-3 days time IF you test positive. Please refrain from intercourse until you hear about your results.   We have tested you for COVID today as well given your constellation of symptoms. Please stay at home and self isolate until you receive your results. If positive you will need to self isolate for 7 days from symptom onset.   Drink plenty of fluids to stay hydrated. I would recommend a BLAND diet at this time to help your stomach.   Follow up with your PCP as well as your OBGYN regarding your ED visit today and to discuss your vaginal spotting that has been occurring since you had the nexplanon implant placed.   Return to the ED for any worsening symptoms

## 2020-09-06 NOTE — ED Provider Notes (Signed)
3:30 PM signout from [employer] at shift change.  Patient awaiting UA and results as well as chest x-ray and results.  She has received Toradol for headache.  She was here with nausea, vomiting, diarrhea and abdominal pain.  Her work-up here is overall reassuring.  Just awaiting reassessment after final treatments.  5:58 PM patient rechecked and updated on results.  She is comfortable with discharge home.  The patient was urged to return to the Emergency Department immediately with worsening of current symptoms, worsening abdominal pain, persistent vomiting, blood noted in stools, fever, or any other concerns. The patient verbalized understanding.   BP 113/71 (BP Location: Right Arm)   Pulse 64   Temp 98.4 F (36.9 C) (Oral)   Resp 16   LMP 08/12/2020 (Approximate)   SpO2 100%      Renne Crigler, PA-C 09/06/20 1759    Cheryll Cockayne, MD 09/07/20 [phone]

## 2020-09-07 LAB — GC/CHLAMYDIA PROBE AMP (~~LOC~~) NOT AT ARMC
Chlamydia: NEGATIVE
Comment: NEGATIVE
Comment: NORMAL
Neisseria Gonorrhea: NEGATIVE

## 2021-03-15 ENCOUNTER — Encounter (HOSPITAL_COMMUNITY): Payer: Self-pay

## 2021-03-15 ENCOUNTER — Ambulatory Visit (HOSPITAL_COMMUNITY)
Admission: EM | Admit: 2021-03-15 | Discharge: 2021-03-15 | Disposition: A | Discharge status: Home / Self Care | Payer: BC Managed Care – PPO | Attending: Emergency Medicine | Admitting: Emergency Medicine

## 2021-03-15 ENCOUNTER — Other Ambulatory Visit: Payer: Self-pay

## 2021-03-15 DIAGNOSIS — R1013 Epigastric pain: Secondary | ICD-10-CM

## 2021-03-15 DIAGNOSIS — R112 Nausea with vomiting, unspecified: Secondary | ICD-10-CM | POA: Diagnosis not present

## 2021-03-15 LAB — POCT URINALYSIS DIPSTICK, ED / UC
Bilirubin Urine: NEGATIVE
Glucose, UA: NEGATIVE mg/dL
Hgb urine dipstick: NEGATIVE
Ketones, ur: NEGATIVE mg/dL
Leukocytes,Ua: NEGATIVE
Nitrite: NEGATIVE
Protein, ur: NEGATIVE mg/dL
Specific Gravity, Urine: 1.025 (ref 1.005–1.030)
Urobilinogen, UA: 0.2 mg/dL (ref 0.0–1.0)
pH: 5.5 (ref 5.0–8.0)

## 2021-03-15 LAB — POC URINE PREG, ED: Preg Test, Ur: NEGATIVE

## 2021-03-15 MED ORDER — ONDANSETRON 4 MG PO TBDP: 4.0000 mg | ORAL_TABLET | Freq: Three times a day (TID) | ORAL | 0 refills | Status: DC | PRN

## 2021-03-15 NOTE — ED Triage Notes (Signed)
Pt presents with generalized abdominal pain and vomiting since yesterday with some chest discomfort.

## 2021-03-15 NOTE — ED Provider Notes (Signed)
[place]    CSN: [id number] Arrival date & time: 03/15/21  [id number]      History   Chief Complaint Chief Complaint  Patient presents with   Abdominal Pain   Emesis    HPI Kathleen Spears is a 25 y.o. female.   Patient presents to the emergency department with a chief complaint of generalized abdominal pain.  She states that the pain is more in the epigastric region.  She reports associated nausea and vomiting.  She states that the pain sometimes radiates up into her chest.  She states that this is not new for her, and she has had chest pain similar to this for many years.  She denies any fever or chills.  Denies cough or sore throat.  States that she did have some alcohol over the weekend, but denies excessive use.  Denies any urinary or vaginal complaints.  She would like a pregnancy test.  She needs a note for work.  The history is provided by the patient. No language interpreter was used.   Past Medical History:  Diagnosis Date   Anxiety attack    Asthma    Herpes    Migraines    Sickle-cell trait [facility])     Patient Active Problem List   Diagnosis Date Noted   Sore throat 09/02/2020   Sickle cell trait ([place]) 07/31/2019   GERD (gastroesophageal reflux disease) 07/31/2019   Anxiety 06/19/2019   Asthma 07/08/2015   Migraine without aura and without status migrainosus, not intractable 05/02/2014    Past Surgical History:  Procedure Laterality Date   TONSILLECTOMY     WISDOM TOOTH EXTRACTION      OB History     Gravida  1   Para  1   Term  1   Preterm      AB      Living  1      SAB      IAB      Ectopic      Multiple  0   Live Births  1            Home Medications    Prior to Admission medications   Medication Sig Start Date End Date Taking? Authorizing Provider  albuterol (VENTOLIN HFA) 108 (90 Base) MCG/ACT inhaler INHALE 2 PUFFS INTO THE LUNGS EVERY 4 HOURS AS NEEDED FOR WHEEZING OR SHORTNESS OF BREATH Patient taking  differently: Inhale 2 puffs into the lungs as needed for wheezing or shortness of breath. 06/08/20   Elsie Stain, MD  cetirizine (ZYRTEC) 10 MG tablet Take 1 tablet (10 mg total) by mouth daily. 09/02/20   Elsie Stain, MD  dicyclomine (BENTYL) 20 MG tablet Take 1 tablet (20 mg total) by mouth 2 (two) times daily. 09/06/20   Alroy Bailiff, Margaux, PA-C  escitalopram (LEXAPRO) 10 MG tablet Take 10 mg by mouth as needed (depression). 07/23/19   [provider]  etonogestrel (NEXPLANON) 68 MG IMPL implant 68 mg by Subdermal route once. Left arm.    [provider]  fluticasone (FLONASE) 50 MCG/ACT nasal spray Place 2 sprays into both nostrils daily. 09/02/20   Elsie Stain, MD  metroNIDAZOLE (FLAGYL) 500 MG tablet Take 1 tablet (500 mg total) by mouth 2 (two) times daily. 09/06/20   Alroy Bailiff, Margaux, PA-C  ondansetron (ZOFRAN ODT) 4 MG disintegrating tablet Take 1 tablet (4 mg total) by mouth every 8 (eight) hours as needed for nausea or vomiting. 09/04/20   Raspet,  Derry Skill, PA-C    Family History Family History  Problem Relation Age of Onset   Bone cancer Mother     Social History Social History   Tobacco Use   Smoking status: Never   Smokeless tobacco: Never  Vaping Use   Vaping Use: Never used  Substance Use Topics   Alcohol use: No    Alcohol/week: 0.0 standard drinks    Comment: social   Drug use: No     Allergies   Hydrocodone   Review of Systems Review of Systems  All other systems reviewed and are negative.   Physical Exam Triage Vital Signs ED Triage Vitals  Enc Vitals Group     BP 03/15/21 1043 120/75     Pulse Rate 03/15/21 1043 66     Resp 03/15/21 1043 18     Temp 03/15/21 1043 98.4 F (36.9 C)     Temp Source 03/15/21 1043 Oral     SpO2 03/15/21 1043 98 %     Weight --      Height --      Head Circumference --      Peak Flow --      Pain Score 03/15/21 1045 6     Pain Loc --      Pain Edu? --      Excl. in [place]? --    No data  found.  Updated Vital Signs BP 120/75 (BP Location: Right Arm)   Pulse 66   Temp 98.4 F (36.9 C) (Oral)   Resp 18   LMP 02/20/2021   SpO2 98%   Visual Acuity Right Eye Distance:   Left Eye Distance:   Bilateral Distance:    Right Eye Near:   Left Eye Near:    Bilateral Near:     Physical Exam Vitals and nursing note reviewed.  Constitutional:      General: She is not in acute distress.    Appearance: She is well-developed.  HENT:     Head: Normocephalic and atraumatic.  Eyes:     Conjunctiva/sclera: Conjunctivae normal.  Cardiovascular:     Rate and Rhythm: Normal rate and regular rhythm.     Heart sounds: No murmur heard. Pulmonary:     Effort: Pulmonary effort is normal. No respiratory distress.     Breath sounds: Normal breath sounds.  Abdominal:     Palpations: Abdomen is soft.     Tenderness: There is no abdominal tenderness.     Comments: No focal abdominal tenderness, no RLQ tenderness or pain at McBurney's point, no RUQ tenderness or Murphy's sign, no left-sided abdominal tenderness, no fluid wave, or signs of peritonitis   Musculoskeletal:        General: Normal range of motion.     Cervical back: Neck supple.  Skin:    General: Skin is warm and dry.  Neurological:     Mental Status: She is alert and oriented to person, place, and time.  Psychiatric:        Mood and Affect: Mood normal.        Behavior: Behavior normal.     UC Treatments / Results  Labs (all labs ordered are listed, but only abnormal results are displayed) Labs Reviewed  POC URINE PREG, ED  POCT URINALYSIS DIPSTICK, ED / UC    EKG   Radiology No results found.  Procedures Procedures (including critical care time)  Medications Ordered in UC Medications - No data to display  Initial Impression / Assessment  and Plan / UC Course  I have reviewed the triage vital signs and the nursing notes.  Pertinent labs & imaging results that were available during my care of the  patient were reviewed by me and considered in my medical decision making (see chart for details).     Patient here with generalized abdominal pain/cramps.  She does not have any focal tenderness on my exam.  Vital signs are stable.  She is in no acute distress.  Requesting a pregnancy test.  She will also need a note for work.  I doubt surgical or acute abdomen.  Urinalysis and urine pregnancy are pending.  Anticipate discharge if negative. Tests are negative.  Trial antiemetic.  If symptoms worsen, followup with PCP. Final Clinical Impressions(s) / UC Diagnoses   Final diagnoses:  Nausea and vomiting, unspecified vomiting type  Abdominal pain, epigastric     Discharge Instructions      Your urinalysis and pregnancy test were both negative.  I recommend taking the nausea medicine as prescribed.  You can also try Tums or Pepto-Bismol.  Monitor your symptoms over the next couple days.  If you run a fever or have worsening pain, return to the urgent care, be seen by your doctor, or go to the emergency department.    ED Prescriptions   None    PDMP not reviewed this encounter.   Roxy Horseman, PA-C 03/15/21 1128

## 2021-03-15 NOTE — Discharge Instructions (Addendum)
Your urinalysis and pregnancy test were both negative.  I recommend taking the nausea medicine as prescribed.  You can also try Tums or Pepto-Bismol.  Monitor your symptoms over the next couple days.  If you run a fever or have worsening pain, return to the urgent care, be seen by your doctor, or go to the emergency department.

## 2021-09-08 IMAGING — DX DG CHEST 1V PORT
1 series · 1 of 1 positions shown · non-contrast
Comparison: 11/21/2019

CLINICAL DATA: Shortness of breath

EXAM:
PORTABLE CHEST 1 VIEW

[chest]
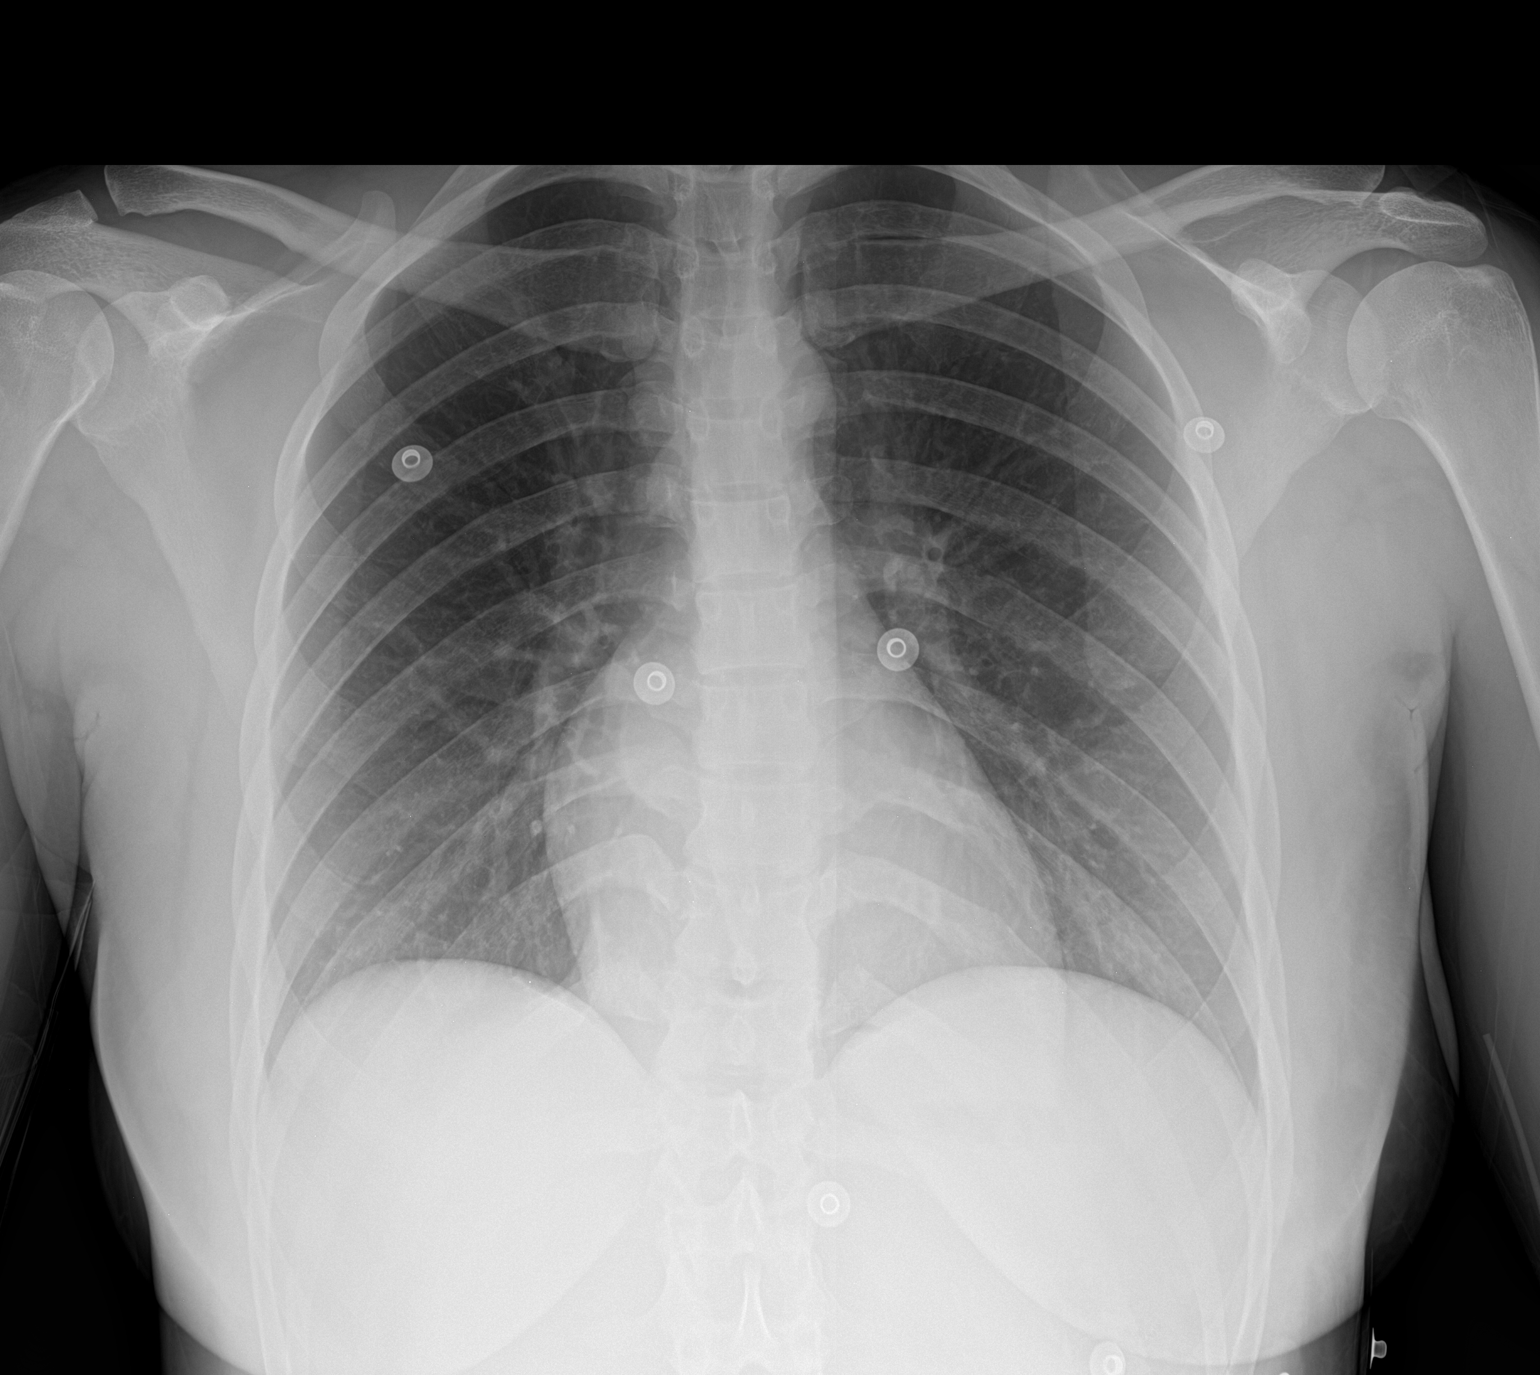

[1 of 1 positions shown; findings below may reference images not displayed]

FINDINGS: The heart size and mediastinal contours are within normal limits.
Both lungs are clear. The visualized skeletal structures are
unremarkable.
IMPRESSION: No active disease.

## 2021-09-27 ENCOUNTER — Encounter (HOSPITAL_COMMUNITY): Payer: Self-pay | Admitting: Emergency Medicine

## 2021-09-27 ENCOUNTER — Ambulatory Visit (HOSPITAL_COMMUNITY)
Admission: EM | Admit: 2021-09-27 | Discharge: 2021-09-27 | Disposition: A | Discharge status: Home / Self Care | Payer: 59 | Attending: Physician Assistant | Admitting: Physician Assistant

## 2021-09-27 ENCOUNTER — Other Ambulatory Visit: Payer: Self-pay

## 2021-09-27 DIAGNOSIS — R112 Nausea with vomiting, unspecified: Secondary | ICD-10-CM

## 2021-09-27 DIAGNOSIS — N926 Irregular menstruation, unspecified: Secondary | ICD-10-CM | POA: Diagnosis present

## 2021-09-27 LAB — POCT URINALYSIS DIPSTICK, ED / UC
Bilirubin Urine: NEGATIVE
Glucose, UA: NEGATIVE mg/dL
Hgb urine dipstick: NEGATIVE
Ketones, ur: NEGATIVE mg/dL
Nitrite: NEGATIVE
Protein, ur: NEGATIVE mg/dL
Specific Gravity, Urine: 1.025 (ref 1.005–1.030)
Urobilinogen, UA: 0.2 mg/dL (ref 0.0–1.0)
pH: 5 (ref 5.0–8.0)

## 2021-09-27 LAB — HCG, QUANTITATIVE, PREGNANCY: hCG, Beta Chain, Quant, S: 1 m[IU]/mL (ref <5)

## 2021-09-27 LAB — POC URINE PREG, ED: Preg Test, Ur: NEGATIVE

## 2021-09-27 MED ORDER — ONDANSETRON 4 MG PO TBDP
4.0000 mg | ORAL_TABLET | Freq: Once | ORAL | Status: AC
  Administered 2021-09-27: 4 mg via ORAL

## 2021-09-27 MED ORDER — ONDANSETRON 4 MG PO TBDP
ORAL_TABLET | ORAL | Status: AC
  Filled 2021-09-27: qty 1

## 2021-09-27 NOTE — ED Provider Notes (Signed)
?[facility] ? ? ? ?CSN: [id number] ?Arrival date & time: 09/27/21  1419 ? ? ?  ? ?History   ?Chief Complaint ?Chief Complaint  ?Patient presents with  ? Abdominal Pain  ? ? ?HPI ?Kathleen Spears is a 26 y.o. female presenting with concern for abdominal pain and nausea with vomiting in pregnancy.  She has been pregnant once before, and had a vaginal birth.  States that she had a night out involving drinking, about 4 days ago.  Following this, she developed abdominal pain with vomiting.  This has improved significantly, but she has still had nausea this morning and throughout the day.  States she has not vomited today, and she is tolerating fluids and some food.  Normal bowel movements.  Abdominal pain is crampy in nature, and tolerable.  There has been no vaginal spotting, vaginal odor, vaginal discharge, dysuria, hematuria, frequency, urgency.  She notes that her last period was over 1 year ago 08/2020, she did have a positive home pregnancy test 4 days ago. ? ?HPI ? ?Past Medical History:  ?Diagnosis Date  ? Anxiety attack   ? Asthma   ? Herpes   ? Migraines   ? Sickle-cell trait (HCC)   ? ? ?Patient Active Problem List  ? Diagnosis Date Noted  ? Sore throat 09/02/2020  ? Sickle cell trait (HCC) 07/31/2019  ? GERD (gastroesophageal reflux disease) 07/31/2019  ? Anxiety 06/19/2019  ? Asthma 07/08/2015  ? Migraine without aura and without status migrainosus, not intractable 05/02/2014  ? ? ?Past Surgical History:  ?Procedure Laterality Date  ? TONSILLECTOMY    ? WISDOM TOOTH EXTRACTION    ? ? ?OB History   ? ? Gravida  ?1  ? Para  ?1  ? Term  ?1  ? Preterm  ?   ? AB  ?   ? Living  ?1  ?  ? ? SAB  ?   ? IAB  ?   ? Ectopic  ?   ? Multiple  ?0  ? Live Births  ?1  ?   ?  ?  ? ? ? ?Home Medications   ? ?Prior to Admission medications   ?Medication Sig Start Date End Date Taking? Authorizing Provider  ?albuterol (VENTOLIN HFA) 108 (90 Base) MCG/ACT inhaler INHALE 2 PUFFS INTO THE LUNGS EVERY 4 HOURS AS NEEDED FOR  WHEEZING OR SHORTNESS OF BREATH ?Patient taking differently: Inhale 2 puffs into the lungs as needed for wheezing or shortness of breath. 06/08/20   Storm Frisk, MD  ?cetirizine (ZYRTEC) 10 MG tablet Take 1 tablet (10 mg total) by mouth daily. 09/02/20   Storm Frisk, MD  ?dicyclomine (BENTYL) 20 MG tablet Take 1 tablet (20 mg total) by mouth 2 (two) times daily. ?Patient not taking: Reported on 09/27/2021 09/06/20   Tanda Rockers, PA-C  ?escitalopram (LEXAPRO) 10 MG tablet Take 10 mg by mouth as needed (depression). ?Patient not taking: Reported on 09/27/2021 07/23/19   [provider]  ?etonogestrel (NEXPLANON) 68 MG IMPL implant 68 mg by Subdermal route once. Left arm. ?Patient not taking: Reported on 09/27/2021    [provider]  ?fluticasone (FLONASE) 50 MCG/ACT nasal spray Place 2 sprays into both nostrils daily. 09/02/20   Storm Frisk, MD  ?metroNIDAZOLE (FLAGYL) 500 MG tablet Take 1 tablet (500 mg total) by mouth 2 (two) times daily. ?Patient not taking: Reported on 09/27/2021 09/06/20   Tanda Rockers, PA-C  ?ondansetron (ZOFRAN ODT) 4 MG disintegrating tablet  Take 1 tablet (4 mg total) by mouth every 8 (eight) hours as needed for nausea or vomiting. ?Patient not taking: Reported on 09/27/2021 03/15/21   Roxy Horseman, PA-C  ? ? ?Family History ?Family History  ?Problem Relation Age of Onset  ? Bone cancer Mother   ? ? ?Social History ?Social History  ? ?Tobacco Use  ? Smoking status: Never  ? Smokeless tobacco: Never  ?Vaping Use  ? Vaping Use: Never used  ?Substance Use Topics  ? Alcohol use: No  ?  Alcohol/week: 0.0 standard drinks  ?  Comment: social  ? Drug use: No  ? ? ? ?Allergies   ?Hydrocodone ? ? ?Review of Systems ?Review of Systems  ?Constitutional:  Negative for appetite change, chills, diaphoresis, fever and unexpected weight change.  ?HENT:  Negative for congestion, ear pain, sinus pressure, sinus pain, sneezing, sore throat and trouble swallowing.    ?Respiratory:  Negative for cough, chest tightness and shortness of breath.   ?Cardiovascular:  Negative for chest pain.  ?Gastrointestinal:  Positive for abdominal pain, nausea and vomiting. Negative for abdominal distention, anal bleeding, blood in stool, constipation, diarrhea and rectal pain.  ?Genitourinary:  Negative for dysuria, flank pain, frequency and urgency.  ?Musculoskeletal:  Negative for back pain and myalgias.  ?Neurological:  Negative for dizziness, light-headedness and headaches.  ?All other systems reviewed and are negative. ? ? ?Physical Exam ?Triage Vital Signs ?ED Triage Vitals  ?Enc Vitals Group  ?   BP 09/27/21 1529 116/76  ?   Pulse Rate 09/27/21 1529 77  ?   Resp 09/27/21 1529 16  ?   Temp 09/27/21 1529 98.2 ?F (36.8 ?C)  ?   Temp Source 09/27/21 1529 Oral  ?   SpO2 09/27/21 1529 96 %  ?   Weight --   ?   Height --   ?   Head Circumference --   ?   Peak Flow --   ?   Pain Score 09/27/21 1526 8  ?   Pain Loc --   ?   Pain Edu? --   ?   Excl. in GC? --   ? ?No data found. ? ?Updated Vital Signs ?BP 116/76 (BP Location: Left Arm)   Pulse 77   Temp 98.2 ?F (36.8 ?C) (Oral)   Resp 16   LMP 08/25/2021   SpO2 96%  ? ?Visual Acuity ?Right Eye Distance:   ?Left Eye Distance:   ?Bilateral Distance:   ? ?Right Eye Near:   ?Left Eye Near:    ?Bilateral Near:    ? ?Physical Exam ?Vitals reviewed.  ?Constitutional:   ?   General: She is not in acute distress. ?   Appearance: Normal appearance. She is not ill-appearing.  ?HENT:  ?   Head: Normocephalic and atraumatic.  ?   Mouth/Throat:  ?   Mouth: Mucous membranes are moist.  ?   Comments: Moist mucous membranes ?Eyes:  ?   Extraocular Movements: Extraocular movements intact.  ?   Pupils: Pupils are equal, round, and reactive to light.  ?Cardiovascular:  ?   Rate and Rhythm: Normal rate and regular rhythm.  ?   Heart sounds: Normal heart sounds.  ?Pulmonary:  ?   Effort: Pulmonary effort is normal.  ?   Breath sounds: Normal breath sounds. No  wheezing, rhonchi or rales.  ?Abdominal:  ?   General: Bowel sounds are normal. There is no distension.  ?   Palpations: Abdomen is soft. There is  no mass.  ?   Tenderness: There is abdominal tenderness in the epigastric area and suprapubic area. There is no right CVA tenderness, left CVA tenderness, guarding or rebound. Negative signs include Murphy's sign, Rovsing's sign and McBurney's sign.  ?   Comments: Minimally tender to palpation suprapubic and epigastric regions, with no guarding or rebound.   ?Skin: ?   General: Skin is warm.  ?   Capillary Refill: Capillary refill takes 2 to 3 seconds.  ?   Comments: Good skin turgor  ?Neurological:  ?   General: No focal deficit present.  ?   Mental Status: She is alert and oriented to person, place, and time.  ?Psychiatric:     ?   Mood and Affect: Mood normal.     ?   Behavior: Behavior normal.  ? ? ? ?UC Treatments / Results  ?Labs ?(all labs ordered are listed, but only abnormal results are displayed) ?Labs Reviewed  ?POCT URINALYSIS DIPSTICK, ED / UC - Abnormal; Notable for the following components:  ?    Result Value  ? Leukocytes,Ua TRACE (*)   ? All other components within normal limits  ?HCG, QUANTITATIVE, PREGNANCY  ?POC URINE PREG, ED  ? ? ?EKG ? ? ?Radiology ?No results found. ? ?Procedures ?Procedures (including critical care time) ? ?Medications Ordered in UC ?Medications  ?ondansetron (ZOFRAN-ODT) disintegrating tablet 4 mg (has no administration in time range)  ? ? ?Initial Impression / Assessment and Plan / UC Course  ?I have reviewed the triage vital signs and the nursing notes. ? ?Pertinent labs & imaging results that were available during my care of the patient were reviewed by me and considered in my medical decision making (see chart for details). ? ?  ? ?This patient is a very pleasant 26 y.o. year old female presenting with nausea and vomiting x4 days following etoh consumption. Symptoms are improving but she is concerned due to positive home  pregnancy test. Negative u-preg today, so we will check a quant hCG.  UA with trace leuks, did not send a culture.  I did administer 4 mg of Zofran ODT following negative u-preg. Strict ED return precautions discussed. Patient

## 2021-09-27 NOTE — ED Triage Notes (Signed)
Patient found out Friday morning that she is pregnant.  Last period was April 2022-and has irregular periods.   ? ?Patient has intermittent abdominal pain.  Pain worsened today.  Has nausea and vomiting starting Friday.  No vomiting today ?

## 2021-09-27 NOTE — Discharge Instructions (Addendum)
-  We'll contact you with any positive lab results, negative results will go straight to mychart ?-If your pregnancy test is negative I'll send zofran to your pharmacy ?-Take the Zofran (ondansetron) up to 3 times daily for nausea and vomiting. Dissolve one pill under your tongue or between your teeth and your cheek. ?-Drink plenty of fluids and eat a bland diet  ?

## 2021-09-28 ENCOUNTER — Telehealth (HOSPITAL_COMMUNITY): Payer: Self-pay | Admitting: Student

## 2021-09-28 MED ORDER — ONDANSETRON 4 MG PO TBDP: 4.0000 mg | ORAL_TABLET | Freq: Three times a day (TID) | ORAL | 0 refills | Status: DC | PRN

## 2021-09-28 NOTE — Telephone Encounter (Signed)
Negative quant hcg - zofran ODT sent to pharmacy. ?

## 2021-10-28 ENCOUNTER — Ambulatory Visit (HOSPITAL_COMMUNITY)
Admission: EM | Admit: 2021-10-28 | Discharge: 2021-10-28 | Disposition: A | Discharge status: Home / Self Care | Payer: 59 | Attending: Internal Medicine | Admitting: Internal Medicine

## 2021-10-28 ENCOUNTER — Encounter (HOSPITAL_COMMUNITY): Payer: Self-pay

## 2021-10-28 DIAGNOSIS — J4521 Mild intermittent asthma with (acute) exacerbation: Secondary | ICD-10-CM

## 2021-10-28 MED ORDER — PREDNISONE 20 MG PO TABS: 40.0000 mg | ORAL_TABLET | Freq: Every day | ORAL | 0 refills | Status: AC

## 2021-10-28 MED ORDER — MONTELUKAST SODIUM 10 MG PO TABS: 10.0000 mg | ORAL_TABLET | Freq: Every day | ORAL | 0 refills | Status: DC

## 2021-10-28 MED ORDER — ALBUTEROL SULFATE HFA 108 (90 BASE) MCG/ACT IN AERS: 1.0000 | INHALATION_SPRAY | Freq: Four times a day (QID) | RESPIRATORY_TRACT | 0 refills | Status: DC | PRN

## 2021-10-28 NOTE — Discharge Instructions (Addendum)
Maintain adequate hydration Take medications as prescribed Use your inhalers as directed If you have any worsening symptoms please return to urgent care to be reevaluated.

## 2021-10-28 NOTE — ED Triage Notes (Signed)
Patient having chest pain, abdominal pain, and back pain since last night. Patient states she woke up in pain. No known falls or injuries.   Patient is short of breath and in sever pain. Pain starts in the chest and radiates to the back and abdomen. Patient states she feels like her hear rate is high. Pain worsens with laying down.   No dietary changes. Patient started taking Metformin a week ago and Provera. Started and finished the Provera.

## 2021-10-29 NOTE — ED Provider Notes (Addendum)
[facility]    CSN: [id number] Arrival date & time: 10/28/21  1132      History   Chief Complaint Chief Complaint  Patient presents with   Abdominal Pain   Back Pain   Chest Pain    HPI Kathleen Spears is a 26 y.o. female with a history of asthma comes to urgent care with a 1 day history of chest and abdominal pain as well as back pain.  Patient describes the pain as throbbing.  She complains of shortness of breath and wheezing.  Chest pain is sharp and radiates to the back.  Is worse when she lays down into her left side.  She denies any cough or sputum production.  No fever or chills.  No calf pain or swelling.  No recent long distance travel.  She is started Provera and metformin a week ago.  No dizziness, near-syncope or syncopal episodes.  She has been using her albuterol inhaler and is currently running low.  She endorses chest tightness.  Patient has a history of seasonal allergies and has been taking over-the-counter Zyrtec as well as fluticasone nasal spray.  He has some throat itching but no eye or nose itching.  No rash noted.  HPI  Past Medical History:  Diagnosis Date   Anxiety attack    Asthma    Herpes    Migraines    Sickle-cell trait [facility])     Patient Active Problem List   Diagnosis Date Noted   Sore throat 09/02/2020   Sickle cell trait (HCC) 07/31/2019   GERD (gastroesophageal reflux disease) 07/31/2019   Anxiety 06/19/2019   Asthma 07/08/2015   Migraine without aura and without status migrainosus, not intractable 05/02/2014    Past Surgical History:  Procedure Laterality Date   TONSILLECTOMY     WISDOM TOOTH EXTRACTION      OB History     Gravida  1   Para  1   Term  1   Preterm      AB      Living  1      SAB      IAB      Ectopic      Multiple  0   Live Births  1            Home Medications    Prior to Admission medications   Medication Sig Start Date End Date Taking? Authorizing Provider  albuterol  (VENTOLIN HFA) 108 (90 Base) MCG/ACT inhaler Inhale 1-2 puffs into the lungs every 6 (six) hours as needed for wheezing or shortness of breath. 10/28/21  Yes Vinessa Macconnell, Britta Mccreedy, MD  cetirizine (ZYRTEC) 10 MG tablet Take 1 tablet (10 mg total) by mouth daily. 09/02/20  Yes Storm Frisk, MD  fluticasone (FLONASE) 50 MCG/ACT nasal spray Place 2 sprays into both nostrils daily. 09/02/20  Yes Storm Frisk, MD  metFORMIN (GLUCOPHAGE) 500 MG tablet Take by mouth. 10/04/21  Yes [provider]  montelukast (SINGULAIR) 10 MG tablet Take 1 tablet (10 mg total) by mouth at bedtime. 10/28/21  Yes Deval Mroczka, Britta Mccreedy, MD  predniSONE (DELTASONE) 20 MG tablet Take 2 tablets (40 mg total) by mouth daily for 5 days. 10/28/21 11/02/21 Yes Tommie Dejoseph, Britta Mccreedy, MD    Family History Family History  Problem Relation Age of Onset   Bone cancer Mother     Social History Social History   Tobacco Use   Smoking status: Never   Smokeless tobacco: Never  Vaping Use   Vaping Use: Never used  Substance Use Topics   Alcohol use: No    Alcohol/week: 0.0 standard drinks of alcohol    Comment: social   Drug use: No     Allergies   Hydrocodone   Review of Systems Review of Systems As per HPI  Physical Exam Triage Vital Signs ED Triage Vitals  Enc Vitals Group     BP 10/28/21 1318 100/66     Pulse Rate 10/28/21 1318 91     Resp 10/28/21 1318 16     Temp 10/28/21 1318 99.3 F (37.4 C)     Temp Source 10/28/21 1318 Oral     SpO2 10/28/21 1318 98 %     Weight 10/28/21 1320 125 lb (56.7 kg)     Height 10/28/21 1320 5\' 1"  (1.549 m)     Head Circumference --      Peak Flow --      Pain Score 10/28/21 1320 10     Pain Loc --      Pain Edu? --      Excl. in GC? --    No data found.  Updated Vital Signs BP 100/66 (BP Location: Left Arm)   Pulse 91   Temp 99.3 F (37.4 C) (Oral)   Resp 16   Ht 5\' 1"  (1.549 m)   Wt 56.7 kg   LMP 10/20/2021 (Exact Date)   SpO2 98%   Breastfeeding No    BMI 23.62 kg/m   Visual Acuity Right Eye Distance:   Left Eye Distance:   Bilateral Distance:    Right Eye Near:   Left Eye Near:    Bilateral Near:     Physical Exam Vitals and nursing note reviewed.  Constitutional:      General: She is not in acute distress.    Appearance: She is well-developed. She is not ill-appearing.  Cardiovascular:     Rate and Rhythm: Normal rate and regular rhythm.     Heart sounds: Normal heart sounds.  Pulmonary:     Effort: Pulmonary effort is normal.     Breath sounds: Wheezing present.  Abdominal:     General: Abdomen is flat. There is no distension.     Palpations: Abdomen is soft. There is no hepatomegaly, splenomegaly or mass.     Tenderness: There is no abdominal tenderness.  Neurological:     Mental Status: She is alert.      UC Treatments / Results  Labs (all labs ordered are listed, but only abnormal results are displayed) Labs Reviewed - No data to display  EKG   Radiology No results found.  Procedures Procedures (including critical care time)  Medications Ordered in UC Medications - No data to display  Initial Impression / Assessment and Plan / UC Course  I have reviewed the triage vital signs and the nursing notes.  Pertinent labs & imaging results that were available during my care of the patient were reviewed by me and considered in my medical decision making (see chart for details).     1.  Mild intermittent asthma with acute exacerbation: Albuterol inhaler every 4-6 hours as needed Prednisone 40 mg orally daily for 5 days Singulair 10 mg orally daily Return to urgent care if symptoms worsen Maintain adequate hydration. Final Clinical Impressions(s) / UC Diagnoses   Final diagnoses:  Mild intermittent asthma with (acute) exacerbation     Discharge Instructions      Maintain adequate hydration Take medications as  prescribed Use your inhalers as directed If you have any worsening symptoms please  return to urgent care to be reevaluated.    ED Prescriptions     Medication Sig Dispense Auth. Provider   montelukast (SINGULAIR) 10 MG tablet Take 1 tablet (10 mg total) by mouth at bedtime. 30 tablet Myrical Andujo, Britta Mccreedy, MD   albuterol (VENTOLIN HFA) 108 (90 Base) MCG/ACT inhaler Inhale 1-2 puffs into the lungs every 6 (six) hours as needed for wheezing or shortness of breath. 18 g Lucylle Foulkes, Britta Mccreedy, MD   predniSONE (DELTASONE) 20 MG tablet Take 2 tablets (40 mg total) by mouth daily for 5 days. 10 tablet Ramsie Ostrander, Britta Mccreedy, MD      PDMP not reviewed this encounter.   Merrilee Jansky, MD 10/29/21 1703    Merrilee Jansky, MD 10/29/21 [phone]

## 2021-12-23 ENCOUNTER — Encounter (HOSPITAL_COMMUNITY): Payer: Self-pay | Admitting: Emergency Medicine

## 2021-12-23 ENCOUNTER — Other Ambulatory Visit: Payer: Self-pay

## 2021-12-23 ENCOUNTER — Emergency Department (HOSPITAL_COMMUNITY)
Admission: EM | Admit: 2021-12-23 | Discharge: 2021-12-23 | Disposition: A | Discharge status: Home / Self Care | Payer: 59 | Attending: Emergency Medicine | Admitting: Emergency Medicine

## 2021-12-23 ENCOUNTER — Emergency Department (HOSPITAL_COMMUNITY): Payer: 59

## 2021-12-23 DIAGNOSIS — Z7951 Long term (current) use of inhaled steroids: Secondary | ICD-10-CM | POA: Diagnosis not present

## 2021-12-23 DIAGNOSIS — R0602 Shortness of breath: Secondary | ICD-10-CM | POA: Insufficient documentation

## 2021-12-23 DIAGNOSIS — R0789 Other chest pain: Secondary | ICD-10-CM | POA: Insufficient documentation

## 2021-12-23 DIAGNOSIS — J45909 Unspecified asthma, uncomplicated: Secondary | ICD-10-CM | POA: Diagnosis not present

## 2021-12-23 DIAGNOSIS — R519 Headache, unspecified: Secondary | ICD-10-CM | POA: Insufficient documentation

## 2021-12-23 LAB — CBC
HCT: 41.4 % (ref 36.0–46.0)
Hemoglobin: 14.4 g/dL (ref 12.0–15.0)
MCH: 28.5 pg (ref 26.0–34.0)
MCHC: 34.8 g/dL (ref 30.0–36.0)
MCV: 81.8 fL (ref 80.0–100.0)
Platelets: 233 10*3/uL (ref 150–400)
RBC: 5.06 MIL/uL (ref 3.87–5.11)
RDW: 12.2 % (ref 11.5–15.5)
WBC: 6.2 10*3/uL (ref 4.0–10.5)
nRBC: 0 % (ref 0.0–0.2)

## 2021-12-23 LAB — TROPONIN I (HIGH SENSITIVITY)
Troponin I (High Sensitivity): <2 ng/L (ref <18)
Troponin I (High Sensitivity): 2 ng/L (ref <18)

## 2021-12-23 LAB — BASIC METABOLIC PANEL
Anion gap: 8 (ref 5–15)
BUN: 8 mg/dL (ref 6–20)
CO2: 26 mmol/L (ref 22–32)
Calcium: 9.5 mg/dL (ref 8.9–10.3)
Chloride: 104 mmol/L (ref 98–111)
Creatinine, Ser: 0.72 mg/dL (ref 0.44–1.00)
GFR, Estimated: >60 mL/min (ref >60)
Glucose, Bld: 95 mg/dL (ref 70–99)
Potassium: 3.9 mmol/L (ref 3.5–5.1)
Sodium: 138 mmol/L (ref 135–145)

## 2021-12-23 LAB — I-STAT BETA HCG BLOOD, ED (MC, WL, AP ONLY): I-stat hCG, quantitative: <5 m[IU]/mL (ref <5)

## 2021-12-23 MED ORDER — MORPHINE SULFATE (PF) 2 MG/ML IV SOLN
2.0000 mg | Freq: Once | INTRAVENOUS | Status: AC
  Administered 2021-12-23: 2 mg via INTRAMUSCULAR
  Filled 2021-12-23: qty 1

## 2021-12-23 MED ORDER — ONDANSETRON HCL 4 MG PO TABS: 4.0000 mg | ORAL_TABLET | Freq: Four times a day (QID) | ORAL | 0 refills | Status: DC

## 2021-12-23 MED ORDER — ACETAMINOPHEN 500 MG PO TABS
1000.0000 mg | ORAL_TABLET | Freq: Once | ORAL | Status: AC
  Administered 2021-12-23: 1000 mg via ORAL
  Filled 2021-12-23: qty 2

## 2021-12-23 MED ORDER — ONDANSETRON 4 MG PO TBDP
8.0000 mg | ORAL_TABLET | Freq: Once | ORAL | Status: AC
  Administered 2021-12-23: 8 mg via ORAL
  Filled 2021-12-23: qty 2

## 2021-12-23 NOTE — ED Provider Triage Note (Signed)
Emergency Medicine Provider Triage Evaluation Note  Kathleen Spears , a 26 y.o. female  was evaluated in triage.  Pt complains of headache and chest pain starting this morning. Just started lexapro for anxiety/depression on Sunday. At work today had severe left sided headache and chest tightness, felt like she was going to throw up and pass out. Went to lay down in her friend's car and didn't get any better. States the chest tightness feels different than her asthma.   Review of Systems  Positive: Headache, chest tightness, lightheadedness, nausea Negative: Fever, cough, abd pain, syncoe  Physical Exam  BP 125/85 (BP Location: Right Arm)   Pulse 63   Temp 98.3 F (36.8 C) (Oral)   Resp 14   SpO2 100%  Gen:   Awake, no distress   Resp:  Normal effort  MSK:   Moves extremities without difficulty  Other:    Medical Decision Making  Medically screening exam initiated at 12:31 PM.  Appropriate orders placed.  Kathleen Spears was informed that the remainder of the evaluation will be completed by another provider, this initial triage assessment does not replace that evaluation, and the importance of remaining in the ED until their evaluation is complete.    Kathleen Monks, PA-C 12/23/21 1232

## 2021-12-23 NOTE — ED Notes (Signed)
Pt teaching provided on medications that may cause drowsiness. Pt instructed not to drive or operate heavy machinery while taking the prescribed medication. Pt verbalized understanding.   Pt provided discharge instructions and prescription information. Pt was given the opportunity to ask questions and questions were answered.   

## 2021-12-23 NOTE — ED Triage Notes (Signed)
Pt with headache and cp with nausea that began one hour after getting to work this morning. No cough, shob, fever, chills, vomiting, or diarrhea.

## 2021-12-23 NOTE — Discharge Instructions (Addendum)
Return to ED with any new symptoms such as increased chest pain or shortness of breath Please follow-up with your PCP for further management Please read attached guide concerning chest wall pain Please pick up prescription of Zofran I have sent in for you

## 2021-12-23 NOTE — ED Provider Notes (Signed)
[facility] Provider Note   CSN: [id number] Arrival date & time: 12/23/21  1138     History  Chief Complaint  Patient presents with   Headache   Chest Pain    Kathleen Spears is a 26 y.o. female with medical history significant for anxiety attacks, asthma, herpes, migraines.  Patient presents to the ED for evaluation of headache, chest tightness and shortness of breath.  The patient states that this morning she woke up, felt fine and after about 30 minutes began having headache on the left side of her head about her temple.  The patient states that this headache is fairly common, she gets headaches often.  The patient denies taking any medication prior to arrival to ED.  Patient also states around this time of headache onset, she began developing left-sided chest pain/tightness that is nonradiating and associated with shortness of breath.  The patient denies any aggravating or alleviating factors.  The patient denies any fevers, nausea, vomiting, diarrhea, abdominal pain, back pain, leg swelling.   Headache Associated symptoms: no abdominal pain, no diarrhea, no fever, no nausea and no vomiting   Chest Pain Associated symptoms: headache and shortness of breath   Associated symptoms: no abdominal pain, no fever, no nausea and no vomiting        Home Medications Prior to Admission medications   Medication Sig Start Date End Date Taking? Authorizing Provider  ondansetron (ZOFRAN) 4 MG tablet Take 1 tablet (4 mg total) by mouth every 6 (six) hours. 12/23/21  Yes Azucena Cecil, PA-C  albuterol (VENTOLIN HFA) 108 (90 Base) MCG/ACT inhaler Inhale 1-2 puffs into the lungs every 6 (six) hours as needed for wheezing or shortness of breath. 10/28/21   Lamptey, Myrene Galas, MD  cetirizine (ZYRTEC) 10 MG tablet Take 1 tablet (10 mg total) by mouth daily. 09/02/20   Elsie Stain, MD  fluticasone (FLONASE) 50 MCG/ACT nasal spray Place 2 sprays into both  nostrils daily. 09/02/20   Elsie Stain, MD  metFORMIN (GLUCOPHAGE) 500 MG tablet Take by mouth. 10/04/21   [provider]  montelukast (SINGULAIR) 10 MG tablet Take 1 tablet (10 mg total) by mouth at bedtime. 10/28/21   Lamptey, Myrene Galas, MD      Allergies    Hydrocodone    Review of Systems   Review of Systems  Constitutional:  Negative for fever.  Respiratory:  Positive for shortness of breath.   Cardiovascular:  Positive for chest pain. Negative for leg swelling.  Gastrointestinal:  Negative for abdominal pain, diarrhea, nausea and vomiting.  Neurological:  Positive for headaches.  All other systems reviewed and are negative.   Physical Exam Updated Vital Signs BP 121/82   Pulse 61   Temp 98 F (36.7 C) (Oral)   Resp 20   LMP 11/20/2021 (Approximate)   SpO2 100%  Physical Exam Vitals and nursing note reviewed.  Constitutional:      General: She is not in acute distress.    Appearance: She is well-developed. She is not ill-appearing, toxic-appearing or diaphoretic.  HENT:     Head: Normocephalic and atraumatic.     Mouth/Throat:     Mouth: Mucous membranes are moist.     Pharynx: Oropharynx is clear.  Eyes:     Extraocular Movements: Extraocular movements intact.     Pupils: Pupils are equal, round, and reactive to light.  Neck:     Vascular: No JVD.  Cardiovascular:  Rate and Rhythm: Normal rate and regular rhythm.  Pulmonary:     Effort: Pulmonary effort is normal.     Breath sounds: Normal breath sounds. No decreased breath sounds or wheezing.  Abdominal:     Palpations: Abdomen is soft.     Tenderness: There is no abdominal tenderness.  Musculoskeletal:     Cervical back: Normal range of motion and neck supple.     Right lower leg: No edema.     Left lower leg: No edema.  Skin:    General: Skin is warm and dry.  Neurological:     Mental Status: She is alert and oriented to person, place, and time.     GCS: GCS eye subscore is 4. GCS  verbal subscore is 5. GCS motor subscore is 6.     Cranial Nerves: Cranial nerves 2-12 are intact. No cranial nerve deficit.     Sensory: Sensation is intact. No sensory deficit.     Motor: Motor function is intact. No weakness.     Coordination: Coordination is intact. Heel to Shin Test normal.     ED Results / Procedures / Treatments   Labs (all labs ordered are listed, but only abnormal results are displayed) Labs Reviewed  BASIC METABOLIC PANEL  CBC  I-STAT BETA HCG BLOOD, ED ([facility], [facility], [facility] ONLY)  TROPONIN I (HIGH SENSITIVITY)  TROPONIN I (HIGH SENSITIVITY)    EKG EKG Interpretation  Date/Time:  Thursday December 23 2021 11:50:24 EDT Ventricular Rate:  63 PR Interval:  166 QRS Duration: 78 QT Interval:  384 QTC Calculation: 392 R Axis:   79 Text Interpretation: Normal sinus rhythm Normal ECG When compared with ECG of 06-Sep-2020 12:38, No significant change since last tracing Confirmed by Linwood Dibbles [phone]) on 12/23/2021 5:48:49 PM  Radiology DG Chest 2 View  Result Date: 12/23/2021 CLINICAL DATA:  Chest pain, shortness of breath EXAM: CHEST - 2 VIEW COMPARISON:  09/06/2020 FINDINGS: The heart size and mediastinal contours are within normal limits. Both lungs are clear. The visualized skeletal structures are unremarkable. IMPRESSION: No active cardiopulmonary disease. Electronically Signed   By: Ernie Avena M.D.   On: 12/23/2021 12:51    Procedures Procedures   Medications Ordered in ED Medications  acetaminophen (TYLENOL) tablet 1,000 mg (1,000 mg Oral Given 12/23/21 1237)  morphine (PF) 2 MG/ML injection 2 mg (2 mg Intramuscular Given 12/23/21 1840)  ondansetron (ZOFRAN-ODT) disintegrating tablet 8 mg (8 mg Oral Given 12/23/21 1839)    ED Course/ Medical Decision Making/ A&P                           Medical Decision Making Amount and/or Complexity of Data Reviewed Labs: ordered. Radiology: ordered.  Risk Prescription drug management.   26 year old  female presents to the ED for evaluation of headache and chest pain.  Please see HPI for further details.  On examination, patient afebrile and nontachycardic.  The patient lung sounds are clear bilaterally, she is not hypoxic on room air.  The patient abdomen is soft and compressible in all 4 quadrants.  The patient neurological examination shows no focal neurodeficits.  Patient nontoxic in appearance.  Patient with nebulizing following labs and imaging studies interpreted by by personally: - Delta troponin less than 2 and then 2 respectively - I-STAT beta-hCG negative - BMP unremarkable - CBC unremarkable - Chest x-ray shows no consolidation, effusion, mediastinal widening - EKG nonischemic sinus rhythm  Patient treated with 1000 mg  Tylenol for headache.  Patient also treated with 8 mg Zofran, 2 mg morphine.  Patient reports feeling better after these medications were given.  At this time, patient will be discharged home.  The patient has been encouraged to follow-up with her PCP for further management.  The patient was given return precautions which she voiced understanding with.  Patient stable.  Final Clinical Impression(s) / ED Diagnoses Final diagnoses:  Chest wall pain  Bad headache    Rx / DC Orders ED Discharge Orders          Ordered    ondansetron (ZOFRAN) 4 MG tablet  Every 6 hours        12/23/21 1857              Clent Ridges 12/23/21 Loraine Maple, MD 12/23/21 2315

## 2022-05-16 ENCOUNTER — Encounter (HOSPITAL_COMMUNITY): Payer: Self-pay

## 2022-05-16 ENCOUNTER — Ambulatory Visit (HOSPITAL_COMMUNITY)
Admission: EM | Admit: 2022-05-16 | Discharge: 2022-05-16 | Disposition: A | Discharge status: Home / Self Care | Payer: Medicaid Other | Attending: Internal Medicine | Admitting: Internal Medicine

## 2022-05-16 DIAGNOSIS — R110 Nausea: Secondary | ICD-10-CM

## 2022-05-16 DIAGNOSIS — Z3202 Encounter for pregnancy test, result negative: Secondary | ICD-10-CM | POA: Diagnosis not present

## 2022-05-16 DIAGNOSIS — R1013 Epigastric pain: Secondary | ICD-10-CM

## 2022-05-16 LAB — POC URINE PREG, ED: Preg Test, Ur: NEGATIVE

## 2022-05-16 LAB — POCT URINALYSIS DIPSTICK, ED / UC
Bilirubin Urine: NEGATIVE
Glucose, UA: NEGATIVE mg/dL
Hgb urine dipstick: NEGATIVE
Ketones, ur: NEGATIVE mg/dL
Nitrite: NEGATIVE
Protein, ur: NEGATIVE mg/dL
Specific Gravity, Urine: >=1.03 (ref 1.005–1.030)
Urobilinogen, UA: 0.2 mg/dL (ref 0.0–1.0)
pH: 5.5 (ref 5.0–8.0)

## 2022-05-16 MED ORDER — FAMOTIDINE 20 MG PO TABS: 20.0000 mg | ORAL_TABLET | Freq: Two times a day (BID) | ORAL | 0 refills | Status: DC

## 2022-05-16 NOTE — ED Triage Notes (Signed)
Onset 3-4 days abdominal pain and cramping. Pt now reports right sided pain. Last menstrual cycle was 04/17/2022.

## 2022-05-16 NOTE — ED Provider Notes (Signed)
[facility]    CSN: [id number] Arrival date & time: 05/16/22  1230      History   Chief Complaint Chief Complaint  Patient presents with   Abdominal Cramping   Abdominal Pain   Flank Pain    HPI Kathleen Spears is a 27 y.o. female.   Patient presents urgent care for evaluation of intermittent nausea without vomiting, bilateral lower abdominal pain, and fluttering in the left upper quadrant of the abdomen that started 3 to 4 days ago.  Last normal bowel movement was this morning, although she has had some recent hard stools without blood or mucus to the stools.  Nausea is intermittent and she has not had any episodes of vomiting or diarrhea.  Denies recent changes in diet or medications.  Reports slight cough but believes this to be residual from recent viral upper respiratory infection.  She denies fever, chills, sore throat, hemoptysis, chest pain, shortness of breath, nasal congestion, headache, ear pain, dizziness, vaginal symptoms, and urinary symptoms.  States she did not drink alcohol last night for New Year's but she does like to drink juice instead of water.  Patient is trying to get pregnant and believes that the flutter that she may have felt in her upper quadrant of her abdomen may be a pregnancy.  Last menstrual cycle started on December 15 and ended on May 06, 2022.  Her menstrual cycles are irregular due to PCOS.  She does not currently take any medications for PCOS and is sexually active without any form of contraception.  Denies recent known exposure to STD.  She also denies acid reflux, recent intake of spicy/acidic foods, and flank pain.  States she is not currently experiencing abdominal discomfort and this comes and goes.   Abdominal Cramping Associated symptoms include abdominal pain.  Abdominal Pain Flank Pain Associated symptoms include abdominal pain.    Past Medical History:  Diagnosis Date   Anxiety attack    Asthma    Herpes    Migraines     Sickle-cell trait [facility](HCC)     Patient Active Problem List   Diagnosis Date Noted   Sore throat 09/02/2020   Sickle cell trait (HCC) 07/31/2019   GERD (gastroesophageal reflux disease) 07/31/2019   Anxiety 06/19/2019   Asthma 07/08/2015   Migraine without aura and without status migrainosus, not intractable 05/02/2014    Past Surgical History:  Procedure Laterality Date   TONSILLECTOMY     WISDOM TOOTH EXTRACTION      OB History     Gravida  1   Para  1   Term  1   Preterm      AB      Living  1      SAB      IAB      Ectopic      Multiple  0   Live Births  1            Home Medications    Prior to Admission medications   Medication Sig Start Date End Date Taking? Authorizing Provider  famotidine (PEPCID) 20 MG tablet Take 1 tablet (20 mg total) by mouth 2 (two) times daily. 05/16/22  Yes Carlisle BeersStanhope, Krystena Reitter M, FNP  albuterol (VENTOLIN HFA) 108 (90 Base) MCG/ACT inhaler Inhale 1-2 puffs into the lungs every 6 (six) hours as needed for wheezing or shortness of breath. 10/28/21   Merrilee JanskyLamptey, Philip O, MD  cetirizine (ZYRTEC) 10 MG tablet Take 1 tablet (10 mg total)  by mouth daily. 09/02/20   Elsie Stain, MD  fluticasone (FLONASE) 50 MCG/ACT nasal spray Place 2 sprays into both nostrils daily. 09/02/20   Elsie Stain, MD  metFORMIN (GLUCOPHAGE) 500 MG tablet Take by mouth. 10/04/21   [provider]  montelukast (SINGULAIR) 10 MG tablet Take 1 tablet (10 mg total) by mouth at bedtime. 10/28/21   Lamptey, Myrene Galas, MD  ondansetron (ZOFRAN) 4 MG tablet Take 1 tablet (4 mg total) by mouth every 6 (six) hours. 12/23/21   Azucena Cecil, PA-C    Family History Family History  Problem Relation Age of Onset   Bone cancer Mother     Social History Social History   Tobacco Use   Smoking status: Never   Smokeless tobacco: Never  Vaping Use   Vaping Use: Never used  Substance Use Topics   Alcohol use: No    Alcohol/week: 0.0 standard  drinks of alcohol    Comment: social   Drug use: No     Allergies   Hydrocodone   Review of Systems Review of Systems  Gastrointestinal:  Positive for abdominal pain.  Genitourinary:  Positive for flank pain.  Per HPI   Physical Exam Triage Vital Signs ED Triage Vitals [05/16/22 1334]  Enc Vitals Group     BP 128/78     Pulse Rate 82     Resp 18     Temp 98.1 F (36.7 C)     Temp Source Oral     SpO2 99 %     Weight      Height      Head Circumference      Peak Flow      Pain Score      Pain Loc      Pain Edu?      Excl. in [place]?    No data found.  Updated Vital Signs BP 128/78 (BP Location: Left Arm)   Pulse 82   Temp 98.1 F (36.7 C) (Oral)   Resp 18   SpO2 99%   Visual Acuity Right Eye Distance:   Left Eye Distance:   Bilateral Distance:    Right Eye Near:   Left Eye Near:    Bilateral Near:     Physical Exam Vitals and nursing note reviewed.  Constitutional:      Appearance: She is not ill-appearing or toxic-appearing.  HENT:     Head: Normocephalic and atraumatic.     Right Ear: Hearing and external ear normal.     Left Ear: Hearing and external ear normal.     Nose: Nose normal.     Mouth/Throat:     Lips: Pink.  Eyes:     General: Lids are normal. Vision grossly intact. Gaze aligned appropriately.     Extraocular Movements: Extraocular movements intact.     Conjunctiva/sclera: Conjunctivae normal.  Cardiovascular:     Rate and Rhythm: Normal rate and regular rhythm.     Heart sounds: Normal heart sounds, S1 normal and S2 normal.  Pulmonary:     Effort: Pulmonary effort is normal. No respiratory distress.     Breath sounds: Normal breath sounds and air entry.  Abdominal:     General: Abdomen is flat. Bowel sounds are normal. There is no distension. There are no signs of injury.     Palpations: Abdomen is soft. There is no shifting dullness, fluid wave, hepatomegaly, mass or pulsatile mass.     Tenderness: There is abdominal  tenderness  in the epigastric area and left upper quadrant. There is no right CVA tenderness, left CVA tenderness or guarding. Negative signs include Murphy's sign and McBurney's sign.  Musculoskeletal:     Cervical back: Neck supple.  Skin:    General: Skin is warm and dry.     Capillary Refill: Capillary refill takes less than 2 seconds.     Findings: No rash.  Neurological:     General: No focal deficit present.     Mental Status: She is alert and oriented to person, place, and time. Mental status is at baseline.     Cranial Nerves: No dysarthria or facial asymmetry.  Psychiatric:        Mood and Affect: Mood normal.        Speech: Speech normal.        Behavior: Behavior normal.        Thought Content: Thought content normal.        Judgment: Judgment normal.      UC Treatments / Results  Labs (all labs ordered are listed, but only abnormal results are displayed) Labs Reviewed  POCT URINALYSIS DIPSTICK, ED / UC - Abnormal; Notable for the following components:      Result Value   Leukocytes,Ua TRACE (*)    All other components within normal limits  POC URINE PREG, ED    EKG   Radiology No results found.  Procedures Procedures (including critical care time)  Medications Ordered in UC Medications - No data to display  Initial Impression / Assessment and Plan / UC Course  I have reviewed the triage vital signs and the nursing notes.  Pertinent labs & imaging results that were available during my care of the patient were reviewed by me and considered in my medical decision making (see chart for details).   1.  Nausea without vomiting, epigastric abdominal pain, negative pregnancy test Urine pregnancy test is negative.  Urinalysis is without evidence of urinary tract infection but specific gravity is elevated indicating dehydration.  Advised patient to increase water intake and decrease intake of juice/other urinary irritants and sugary beverages.  She is tender to  palpation of the epigastrium as well as left upper quadrant abdomen.  Her slight cough, nausea, and abdominal tenderness to the epigastric region are indicative of likely acid reflux and flareup of her GERD.  Abdominal exam is without peritoneal signs and vitals are hemodynamically stable, therefore we deferred imaging in the ED referral.  We will trial famotidine 20 mg twice daily for the next 2 weeks and dietary changes to avoid known GERD triggers to see if this helps with symptoms.  She is to follow-up with her primary care provider for ongoing evaluation should symptoms fail to improve with interventions provided today.  She is agreeable with this plan.  Discussed physical exam and available lab work findings in clinic with patient.  Counseled patient regarding appropriate use of medications and potential side effects for all medications recommended or prescribed today. Discussed red flag signs and symptoms of worsening condition,when to call the PCP office, return to urgent care, and when to seek higher level of care in the emergency department. Patient verbalizes understanding and agreement with plan. All questions answered. Patient discharged in stable condition.    Final Clinical Impressions(s) / UC Diagnoses   Final diagnoses:  Nausea without vomiting  Abdominal pain, epigastric  Negative pregnancy test     Discharge Instructions      Your urinalysis does not show urinary tract  infection. Your pregnancy test is negative.  I believe your abdominal discomfort is due to gastroesophageal reflux.  Start taking 20 mg famotidine twice daily (before breakfast and before dinner) for the next 2 weeks to see if this helps with your nausea and abdominal pain.  Stay sitting up for at least 2 hours before laying down to eat. Avoid eating spicy, greasy, and fatty foods as these will trigger your acid reflux.  If you develop any new or worsening symptoms or do not improve in the next 2 to 3  days, please return.  If your symptoms are severe, please go to the emergency room.  Follow-up with your primary care provider for further evaluation and management of your symptoms as well as ongoing wellness visits.  I hope you feel better!     ED Prescriptions     Medication Sig Dispense Auth. Provider   famotidine (PEPCID) 20 MG tablet Take 1 tablet (20 mg total) by mouth 2 (two) times daily. 30 tablet Talbot Grumbling, FNP      PDMP not reviewed this encounter.   Talbot Grumbling, [place] 05/16/22 1432

## 2022-05-16 NOTE — Discharge Instructions (Addendum)
Your urinalysis does not show urinary tract infection. Your pregnancy test is negative.  I believe your abdominal discomfort is due to gastroesophageal reflux.  Start taking 20 mg famotidine twice daily (before breakfast and before dinner) for the next 2 weeks to see if this helps with your nausea and abdominal pain.  Stay sitting up for at least 2 hours before laying down to eat. Avoid eating spicy, greasy, and fatty foods as these will trigger your acid reflux.  If you develop any new or worsening symptoms or do not improve in the next 2 to 3 days, please return.  If your symptoms are severe, please go to the emergency room.  Follow-up with your primary care provider for further evaluation and management of your symptoms as well as ongoing wellness visits.  I hope you feel better!

## 2022-07-19 ENCOUNTER — Ambulatory Visit (HOSPITAL_COMMUNITY)
Admission: EM | Admit: 2022-07-19 | Discharge: 2022-07-19 | Disposition: A | Discharge status: Home / Self Care | Payer: Medicaid Other | Attending: Emergency Medicine | Admitting: Emergency Medicine

## 2022-07-19 ENCOUNTER — Encounter (HOSPITAL_COMMUNITY): Payer: Self-pay | Admitting: *Deleted

## 2022-07-19 ENCOUNTER — Other Ambulatory Visit: Payer: Self-pay

## 2022-07-19 DIAGNOSIS — A084 Viral intestinal infection, unspecified: Secondary | ICD-10-CM

## 2022-07-19 DIAGNOSIS — R112 Nausea with vomiting, unspecified: Secondary | ICD-10-CM

## 2022-07-19 LAB — POCT URINALYSIS DIPSTICK, ED / UC
Bilirubin Urine: NEGATIVE
Glucose, UA: NEGATIVE mg/dL
Hgb urine dipstick: NEGATIVE
Ketones, ur: NEGATIVE mg/dL
Nitrite: NEGATIVE
Protein, ur: NEGATIVE mg/dL
Specific Gravity, Urine: 1.025 (ref 1.005–1.030)
Urobilinogen, UA: 2 mg/dL — ABNORMAL HIGH (ref 0.0–1.0)
pH: 5.5 (ref 5.0–8.0)

## 2022-07-19 LAB — POC URINE PREG, ED: Preg Test, Ur: NEGATIVE

## 2022-07-19 MED ORDER — ONDANSETRON 4 MG PO TBDP
4.0000 mg | ORAL_TABLET | Freq: Once | ORAL | Status: AC
  Administered 2022-07-19: 4 mg via ORAL

## 2022-07-19 MED ORDER — ONDANSETRON 4 MG PO TBDP
ORAL_TABLET | ORAL | Status: AC
  Filled 2022-07-19: qty 1

## 2022-07-19 MED ORDER — LOPERAMIDE HCL 2 MG PO TABS: 2.0000 mg | ORAL_TABLET | Freq: Four times a day (QID) | ORAL | 0 refills | Status: DC | PRN

## 2022-07-19 MED ORDER — ONDANSETRON 4 MG PO TBDP: 4.0000 mg | ORAL_TABLET | Freq: Three times a day (TID) | ORAL | 0 refills | Status: DC | PRN

## 2022-07-19 NOTE — ED Triage Notes (Signed)
Pt reports nausea,vomiting and diarrhea started yesterday. Pt reports sh evomited all day yesterday. Pt did try to eat but vomited .

## 2022-07-19 NOTE — Discharge Instructions (Addendum)
The treatment for viral gastroenteritis is supportive.  You are already doing all the right things by eating bland foods and drinking clear fluids.  I have enclosed information about self-care at home.  I have sent 2 prescriptions to your pharmacy to help you with your symptoms.  The first is Zofran, you received a dose during your visit today so your next dose will be due around 7 PM this evening.  I also sent a prescription for Imodium which you should take every time you have an episode of diarrhea.  Imodium slows your bowels down and, after 2-3 doses, your diarrhea should completely resolved.  If your symptoms of diarrhea, or vomiting, persist more than a week to 10 days, it is important that you are seen for repeat evaluation either by your primary care provider or here at urgent care.  If your symptoms become worse in the next few days, you are not able to keep any food or liquids down at all or you are having more than 8 episodes of diarrhea, feeling dizzy, developing fever or have an altered mental status, please go to the emergency room for further, more acute evaluation.  Your urine pregnancy test today was negative and your urinalysis did not reveal any concerns for acute dehydration.  Please be sure that you are drinking plenty of clear liquids, preferably electrolyte replacement fluids such as Gatorade or Pedialyte, throughout the day, it is okay if you do not feel like eating.  Thank you for visiting urgent care today.

## 2022-07-19 NOTE — ED Provider Notes (Signed)
[place]    CSN: [id number] Arrival date & time: 07/19/22  [id number]    HISTORY   Chief Complaint  Patient presents with   Diarrhea   Nausea   Emesis   HPI Kathleen Spears is a pleasant, 27 y.o. female who presents to urgent care today. Patient complains of a 1 day history of nausea, vomiting and diarrhea that began yesterday morning.  Patient denies blood and mucus in stool, denies hematemesis.  Patient states the first episode of vomiting was her entire meal from the night before, states diarrhea began this morning.  Patient states that she vomited "all day" yesterday.  Patient states she did attempt to eat but vomited everything that she ate.  Patient has normal vital signs on arrival today.  Patient reports her menstrual cycle is irregular.  UPT today was negative.  The history is provided by the patient.   Past Medical History:  Diagnosis Date   Anxiety attack    Asthma    Herpes    Migraines    Sickle-cell trait [facility])    Patient Active Problem List   Diagnosis Date Noted   Sore throat 09/02/2020   Sickle cell trait ([place]) 07/31/2019   GERD (gastroesophageal reflux disease) 07/31/2019   Anxiety 06/19/2019   Asthma 07/08/2015   Migraine without aura and without status migrainosus, not intractable 05/02/2014   Past Surgical History:  Procedure Laterality Date   TONSILLECTOMY     WISDOM TOOTH EXTRACTION     OB History     Gravida  1   Para  1   Term  1   Preterm      AB      Living  1      SAB      IAB      Ectopic      Multiple  0   Live Births  1          Home Medications    Prior to Admission medications   Medication Sig Start Date End Date Taking? Authorizing Provider  albuterol (VENTOLIN HFA) 108 (90 Base) MCG/ACT inhaler Inhale 1-2 puffs into the lungs every 6 (six) hours as needed for wheezing or shortness of breath. 10/28/21   Lamptey, Myrene Galas, MD  cetirizine (ZYRTEC) 10 MG tablet Take 1 tablet (10 mg total) by mouth  daily. 09/02/20   Elsie Stain, MD  famotidine (PEPCID) 20 MG tablet Take 1 tablet (20 mg total) by mouth 2 (two) times daily. 05/16/22   Talbot Grumbling, FNP  fluticasone (FLONASE) 50 MCG/ACT nasal spray Place 2 sprays into both nostrils daily. 09/02/20   Elsie Stain, MD  metFORMIN (GLUCOPHAGE) 500 MG tablet Take by mouth. 10/04/21   [provider]  montelukast (SINGULAIR) 10 MG tablet Take 1 tablet (10 mg total) by mouth at bedtime. 10/28/21   Lamptey, Myrene Galas, MD  ondansetron (ZOFRAN) 4 MG tablet Take 1 tablet (4 mg total) by mouth every 6 (six) hours. 12/23/21   Azucena Cecil, PA-C    Family History Family History  Problem Relation Age of Onset   Bone cancer Mother    Social History Social History   Tobacco Use   Smoking status: Never   Smokeless tobacco: Never  Vaping Use   Vaping Use: Never used  Substance Use Topics   Alcohol use: No    Alcohol/week: 0.0 standard drinks of alcohol    Comment: social   Drug use: No   Allergies  Hydrocodone  Review of Systems Review of Systems Pertinent findings revealed after performing a 14 point review of systems has been noted in the history of present illness.  Physical Exam Vital Signs BP 115/76   Pulse 60   Temp 98.2 F (36.8 C)   Resp 16   LMP 06/29/2022   SpO2 98%   No data found.  Physical Exam Vitals and nursing note reviewed.  Constitutional:      General: She is not in acute distress.    Appearance: Normal appearance.  HENT:     Head: Normocephalic and atraumatic.  Eyes:     Pupils: Pupils are equal, round, and reactive to light.  Cardiovascular:     Rate and Rhythm: Normal rate and regular rhythm.  Pulmonary:     Effort: Pulmonary effort is normal.     Breath sounds: Normal breath sounds.  Abdominal:     General: Abdomen is flat. Bowel sounds are normal. There is no distension.     Palpations: Abdomen is soft.     Tenderness: There is no abdominal tenderness. There is no  guarding.  Musculoskeletal:        General: Normal range of motion.     Cervical back: Normal range of motion and neck supple.  Skin:    General: Skin is warm and dry.  Neurological:     General: No focal deficit present.     Mental Status: She is alert and oriented to person, place, and time. Mental status is at baseline.  Psychiatric:        Mood and Affect: Mood normal.        Behavior: Behavior normal.        Thought Content: Thought content normal.        Judgment: Judgment normal.     Visual Acuity Right Eye Distance:   Left Eye Distance:   Bilateral Distance:    Right Eye Near:   Left Eye Near:    Bilateral Near:     UC Couse / Diagnostics / Procedures:     Radiology No results found.  Procedures Procedures (including critical care time) EKG  Pending results:  Labs Reviewed  POCT URINALYSIS DIPSTICK, ED / UC - Abnormal; Notable for the following components:      Result Value   Urobilinogen, UA 2.0 (*)    Leukocytes,Ua SMALL (*)    All other components within normal limits  POC URINE PREG, ED    Medications Ordered in UC: Medications  ondansetron (ZOFRAN-ODT) disintegrating tablet 4 mg (4 mg Oral Given 07/19/22 1108)    UC Diagnoses / Final Clinical Impressions(s)   I have reviewed the triage vital signs and the nursing notes.  Pertinent labs & imaging results that were available during my care of the patient were reviewed by me and considered in my medical decision making (see chart for details).    Final diagnoses:  Nausea vomiting and diarrhea  Viral gastroenteritis   Urinalysis not grossly concerning for acute dehydration.  Patient provided with Zofran and Imodium for symptomatic relief of viral gastroenteritis.  Patient politely declined viral testing for COVID-19 and influenza.  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  Patient education provided for self-care at home.  Note provided for work.  Conservative care  recommended, return precautions advised.  Please see discharge instructions below for details of plan of care as provided to patient. ED Prescriptions     Medication Sig Dispense Auth. Provider  ondansetron (ZOFRAN-ODT) 4 MG disintegrating tablet Take 1 tablet (4 mg total) by mouth every 8 (eight) hours as needed for nausea or vomiting. 20 tablet Lynden Oxford Scales, PA-C   loperamide (IMODIUM A-D) 2 MG tablet Take 1 tablet (2 mg total) by mouth 4 (four) times daily as needed for diarrhea or loose stools. 30 tablet Lynden Oxford Scales, PA-C      PDMP not reviewed this encounter.  Pending results:  Labs Reviewed  POCT URINALYSIS DIPSTICK, ED / UC - Abnormal; Notable for the following components:      Result Value   Urobilinogen, UA 2.0 (*)    Leukocytes,Ua SMALL (*)    All other components within normal limits  POC URINE PREG, ED    Discharge Instructions:   Discharge Instructions      The treatment for viral gastroenteritis is supportive.  You are already doing all the right things by eating bland foods and drinking clear fluids.  I have enclosed information about self-care at home.  I have sent 2 prescriptions to your pharmacy to help you with your symptoms.  The first is Zofran, you received a dose during your visit today so your next dose will be due around 7 PM this evening.  I also sent a prescription for Imodium which you should take every time you have an episode of diarrhea.  Imodium slows your bowels down and, after 2-3 doses, your diarrhea should completely resolved.  If your symptoms of diarrhea, or vomiting, persist more than a week to 10 days, it is important that you are seen for repeat evaluation either by your primary care provider or here at urgent care.  If your symptoms become worse in the next few days, you are not able to keep any food or liquids down at all or you are having more than 8 episodes of diarrhea, feeling dizzy, developing fever or have  an altered mental status, please go to the emergency room for further, more acute evaluation.  Your urine pregnancy test today was negative and your urinalysis did not reveal any concerns for acute dehydration.  Please be sure that you are drinking plenty of clear liquids, preferably electrolyte replacement fluids such as Gatorade or Pedialyte, throughout the day, it is okay if you do not feel like eating.  Thank you for visiting urgent care today.      Disposition Upon Discharge:  Condition: stable for discharge home  Patient presented with an acute illness with associated systemic symptoms and significant discomfort requiring urgent management. In my opinion, this is a condition that a prudent lay person (someone who possesses an average knowledge of health and medicine) may potentially expect to result in complications if not addressed urgently such as respiratory distress, impairment of bodily function or dysfunction of bodily organs.   Routine symptom specific, illness specific and/or disease specific instructions were discussed with the patient and/or caregiver at length.   As such, the patient has been evaluated and assessed, work-up was performed and treatment was provided in alignment with urgent care protocols and evidence based medicine.  Patient/parent/caregiver has been advised that the patient may require follow up for further testing and treatment if the symptoms continue in spite of treatment, as clinically indicated and appropriate.  Patient/parent/caregiver has been advised to return to the [facility] or PCP if no better; to PCP or the Emergency Department if new signs and symptoms develop, or if the current signs or symptoms continue to change or worsen for further workup, evaluation  and treatment as clinically indicated and appropriate  The patient will follow up with their current PCP if and as advised. If the patient does not currently have a PCP we will assist them in obtaining  one.   The patient may need specialty follow up if the symptoms continue, in spite of conservative treatment and management, for further workup, evaluation, consultation and treatment as clinically indicated and appropriate.  Patient/parent/caregiver verbalized understanding and agreement of plan as discussed.  All questions were addressed during visit.  Please see discharge instructions below for further details of plan.  This office note has been dictated using Museum/gallery curator.  Unfortunately, this method of dictation can sometimes lead to typographical or grammatical errors.  I apologize for your inconvenience in advance if this occurs.  Please do not hesitate to reach out to me if clarification is needed.      Lynden Oxford Scales, PA-C 07/19/22 1125

## 2022-08-14 ENCOUNTER — Emergency Department (HOSPITAL_COMMUNITY)
Admission: EM | Admit: 2022-08-14 | Discharge: 2022-08-15 | Disposition: A | Discharge status: Home / Self Care | Payer: Medicaid Other | Attending: Emergency Medicine | Admitting: Emergency Medicine

## 2022-08-14 ENCOUNTER — Other Ambulatory Visit: Payer: Self-pay

## 2022-08-14 DIAGNOSIS — N939 Abnormal uterine and vaginal bleeding, unspecified: Secondary | ICD-10-CM | POA: Insufficient documentation

## 2022-08-14 DIAGNOSIS — R1084 Generalized abdominal pain: Secondary | ICD-10-CM

## 2022-08-14 DIAGNOSIS — Z7984 Long term (current) use of oral hypoglycemic drugs: Secondary | ICD-10-CM | POA: Diagnosis not present

## 2022-08-14 DIAGNOSIS — R112 Nausea with vomiting, unspecified: Secondary | ICD-10-CM | POA: Diagnosis not present

## 2022-08-14 DIAGNOSIS — R109 Unspecified abdominal pain: Secondary | ICD-10-CM | POA: Diagnosis not present

## 2022-08-14 LAB — URINALYSIS, ROUTINE W REFLEX MICROSCOPIC
Bilirubin Urine: NEGATIVE
Glucose, UA: NEGATIVE mg/dL
Ketones, ur: NEGATIVE mg/dL
Leukocytes,Ua: NEGATIVE
Nitrite: NEGATIVE
Protein, ur: NEGATIVE mg/dL
Specific Gravity, Urine: 1.021 (ref 1.005–1.030)
pH: 5 (ref 5.0–8.0)

## 2022-08-14 LAB — COMPREHENSIVE METABOLIC PANEL
ALT: 16 U/L (ref 0–44)
AST: 19 U/L (ref 15–41)
Albumin: 4.3 g/dL (ref 3.5–5.0)
Alkaline Phosphatase: 52 U/L (ref 38–126)
Anion gap: 9 (ref 5–15)
BUN: 10 mg/dL (ref 6–20)
CO2: 23 mmol/L (ref 22–32)
Calcium: 9.5 mg/dL (ref 8.9–10.3)
Chloride: 106 mmol/L (ref 98–111)
Creatinine, Ser: 0.7 mg/dL (ref 0.44–1.00)
GFR, Estimated: >60 mL/min (ref >60)
Glucose, Bld: 92 mg/dL (ref 70–99)
Potassium: 3.8 mmol/L (ref 3.5–5.1)
Sodium: 138 mmol/L (ref 135–145)
Total Bilirubin: 0.6 mg/dL (ref 0.3–1.2)
Total Protein: 7.6 g/dL (ref 6.5–8.1)

## 2022-08-14 LAB — CBC WITH DIFFERENTIAL/PLATELET
Abs Immature Granulocytes: 0.02 10*3/uL (ref 0.00–0.07)
Basophils Absolute: 0 10*3/uL (ref 0.0–0.1)
Basophils Relative: 1 %
Eosinophils Absolute: 0.1 10*3/uL (ref 0.0–0.5)
Eosinophils Relative: 1 %
HCT: 41.5 % (ref 36.0–46.0)
Hemoglobin: 13.9 g/dL (ref 12.0–15.0)
Immature Granulocytes: 0 %
Lymphocytes Relative: 37 %
Lymphs Abs: 2.1 10*3/uL (ref 0.7–4.0)
MCH: 27.3 pg (ref 26.0–34.0)
MCHC: 33.5 g/dL (ref 30.0–36.0)
MCV: 81.5 fL (ref 80.0–100.0)
Monocytes Absolute: 0.6 10*3/uL (ref 0.1–1.0)
Monocytes Relative: 11 %
Neutro Abs: 2.8 10*3/uL (ref 1.7–7.7)
Neutrophils Relative %: 50 %
Platelets: 303 10*3/uL (ref 150–400)
RBC: 5.09 MIL/uL (ref 3.87–5.11)
RDW: 12.6 % (ref 11.5–15.5)
WBC: 5.7 10*3/uL (ref 4.0–10.5)
nRBC: 0 % (ref 0.0–0.2)

## 2022-08-14 LAB — RAPID URINE DRUG SCREEN, HOSP PERFORMED
Amphetamines: NOT DETECTED
Barbiturates: NOT DETECTED
Benzodiazepines: NOT DETECTED
Cocaine: NOT DETECTED
Opiates: NOT DETECTED
Tetrahydrocannabinol: NOT DETECTED

## 2022-08-14 LAB — LIPASE, BLOOD: Lipase: 44 U/L (ref 11–51)

## 2022-08-14 LAB — I-STAT BETA HCG BLOOD, ED (MC, WL, AP ONLY): I-stat hCG, quantitative: <5 m[IU]/mL (ref <5)

## 2022-08-14 MED ORDER — SODIUM CHLORIDE 0.9 % IV BOLUS
1000.0000 mL | Freq: Once | INTRAVENOUS | Status: AC
  Administered 2022-08-14: 1000 mL via INTRAVENOUS

## 2022-08-14 MED ORDER — DROPERIDOL 2.5 MG/ML IJ SOLN
2.5000 mg | Freq: Once | INTRAMUSCULAR | Status: AC
  Administered 2022-08-14: 2.5 mg via INTRAVENOUS
  Filled 2022-08-14: qty 2

## 2022-08-14 NOTE — ED Triage Notes (Signed)
Patient reports heavy menstrual period with clots this week and hypogastric cramping.

## 2022-08-14 NOTE — ED Provider Triage Note (Signed)
Emergency Medicine Provider Triage Evaluation Note  Kathleen Spears , a 27 y.o. female  was evaluated in triage.  Pt complains of heavy menstrual cycle with clots this week. She started her menstrual cycle on 4 days ago and since has gone through 12 total pads (5 of which were regular and the remaining 7 were super plus).  Has associated upper abdominal pain, nausea.  Denies history of similar symptoms.  She does have OB/GYN specialist.  Denies vomiting.  Notes possible concern for pregnancy.  Doubts concern for STI at this time.  Review of Systems  Positive:  Negative:   Physical Exam  BP (!) 131/91 (BP Location: Right Arm)   Pulse 92   Temp 98.4 F (36.9 C) (Oral)   Resp 16   LMP 08/11/2022   SpO2 100%  Gen:   Awake, no distress   Resp:  Normal effort  MSK:   Moves extremities without difficulty  Other:  Upper abdominal tenderness to palpation.  GU exam deferred in triage.  Medical Decision Making  Medically screening exam initiated at 7:28 PM.  Appropriate orders placed.  Kathleen Spears was informed that the remainder of the evaluation will be completed by another provider, this initial triage assessment does not replace that evaluation, and the importance of remaining in the ED until their evaluation is complete.  Workup initiated   Cooper Moroney A, PA-C 08/14/22 1934

## 2022-08-14 NOTE — ED Provider Notes (Signed)
[place] Provider Note   CSN: [id number] Arrival date & time: 08/14/22  1859     History  Chief Complaint  Patient presents with   Heavy Menstrual Period    Kathleen Spears is a 27 y.o. female with history of migraines, asthma, sickle cell trait, herpes who presents the emergency department complaining of abdominal pain and heavy vaginal bleeding.  Patient states that he she started her menstrual cycle 4 days ago, and has had significant heavy bleeding with clots.  Has associated upper abdominal pain, nausea and vomiting.  Denies history of similar symptoms.  States she does have an OB/GYN.  Is concern for pregnancy, doubts concern for STI.  Denies dysuria, diarrhea or constipation, fever.  HPI     Home Medications Prior to Admission medications   Medication Sig Start Date End Date Taking? Authorizing Provider  albuterol (VENTOLIN HFA) 108 (90 Base) MCG/ACT inhaler Inhale 1-2 puffs into the lungs every 6 (six) hours as needed for wheezing or shortness of breath. 10/28/21   Lamptey, Myrene Galas, MD  cetirizine (ZYRTEC) 10 MG tablet Take 1 tablet (10 mg total) by mouth daily. 09/02/20   Elsie Stain, MD  famotidine (PEPCID) 20 MG tablet Take 1 tablet (20 mg total) by mouth 2 (two) times daily. 05/16/22   Talbot Grumbling, FNP  fluticasone (FLONASE) 50 MCG/ACT nasal spray Place 2 sprays into both nostrils daily. 09/02/20   Elsie Stain, MD  loperamide (IMODIUM A-D) 2 MG tablet Take 1 tablet (2 mg total) by mouth 4 (four) times daily as needed for diarrhea or loose stools. 07/19/22   Lynden Oxford Scales, PA-C  metFORMIN (GLUCOPHAGE) 500 MG tablet Take by mouth. 10/04/21   [provider]  montelukast (SINGULAIR) 10 MG tablet Take 1 tablet (10 mg total) by mouth at bedtime. 10/28/21   Lamptey, Myrene Galas, MD  ondansetron (ZOFRAN-ODT) 4 MG disintegrating tablet Take 1 tablet (4 mg total) by mouth every 8 (eight) hours as needed for  nausea or vomiting. 07/19/22   Lynden Oxford Scales, PA-C      Allergies    Hydrocodone    Review of Systems   Review of Systems  Gastrointestinal:  Positive for abdominal pain, nausea and vomiting.  Genitourinary:  Positive for vaginal bleeding.  All other systems reviewed and are negative.   Physical Exam Updated Vital Signs BP 114/63   Pulse 81   Temp 97.7 F (36.5 C) (Oral)   Resp 14   LMP 08/11/2022   SpO2 100%  Physical Exam Vitals and nursing note reviewed.  Constitutional:      Appearance: Normal appearance.     Comments: Drowsy on exam  HENT:     Head: Normocephalic and atraumatic.  Eyes:     Conjunctiva/sclera: Conjunctivae normal.  Cardiovascular:     Rate and Rhythm: Normal rate and regular rhythm.  Pulmonary:     Effort: Pulmonary effort is normal. No respiratory distress.     Breath sounds: Normal breath sounds.  Abdominal:     General: There is no distension.     Palpations: Abdomen is soft.     Tenderness: There is abdominal tenderness.     Comments: Saying "ow" when palpated across the generalized abdomen, yet localizes pain to the periumbilical region  Skin:    General: Skin is warm and dry.  Neurological:     General: No focal deficit present.     Mental Status: She is alert.  ED Results / Procedures / Treatments   Labs (all labs ordered are listed, but only abnormal results are displayed) Labs Reviewed  URINALYSIS, ROUTINE W REFLEX MICROSCOPIC - Abnormal; Notable for the following components:      Result Value   Hgb urine dipstick LARGE (*)    Bacteria, UA RARE (*)    All other components within normal limits  CBC WITH DIFFERENTIAL/PLATELET  COMPREHENSIVE METABOLIC PANEL  LIPASE, BLOOD  RAPID URINE DRUG SCREEN, HOSP PERFORMED  I-STAT BETA HCG BLOOD, ED ([facility], [facility], [facility] ONLY)    EKG None  Radiology No results found.  Procedures Procedures    Medications Ordered in ED Medications  sodium chloride 0.9 % bolus 1,000 mL (0 mLs  Intravenous Stopped 08/14/22 2331)  droperidol (INAPSINE) 2.5 MG/ML injection 2.5 mg (2.5 mg Intravenous Given 08/14/22 2221)    ED Course/ Medical Decision Making/ A&P Clinical Course as of 08/14/22 2346  Sun Aug 14, 2022  2343 Heavy menstrual cycle 4 days ago.  Not pregnant.  NV and abd pain. Epigastric/periumbilical pain.  [WF]    Clinical Course User Index [WF] Tedd Sias, PA                             Medical Decision Making Amount and/or Complexity of Data Reviewed Labs: ordered. Radiology: ordered.  Risk Prescription drug management.  This patient is a 27 y.o. female  who presents to the ED for concern of abdominal pain and vaginal bleeding.   Differential diagnoses prior to evaluation: The emergent differential diagnosis includes, but is not limited to,  Abnormal uterine bleeding, threatened miscarriage, incomplete miscarriage, normal bleeding from an early trimester pregnancy, ectopic pregnancy, vaginal/cervical trauma, subchorionic hemorrhage/hematoma. This is not an exhaustive differential.   Past Medical History / Co-morbidities: migraines, asthma, sickle cell trait, herpes  Additional history: Chart reviewed. Pertinent results include: Urgent care visits on 3/5 and 1/1 for nausea and vomiting  Physical Exam: Physical exam performed. The pertinent findings include: Normal vital signs, no acute distress.  Drowsy on exam after medications given.  Still complaining of pain 10/10 of severity while resting comfortably, and nausea 10/10.  Abdomen soft with generalized tenderness, however patient localizes it to periumbilical area.  Lab Tests/Imaging studies: I personally interpreted labs/imaging and the pertinent results include: No leukocytosis, normal hemoglobin.  CMP unremarkable, normal kidney and liver function.  Normal lipase.  Negative pregnancy.  Urinalysis with large hemoglobin, suspect contamination with menstrual bleeding, no evidence of UTI.  UDS  negative.  CT abdomen/pelvis pending at time of shift change.   Medications: I ordered medication including IVF and droperidol.  I have reviewed the patients home medicines and have made adjustments as needed.   Disposition: Patient discussed and care transferred to [facility] at shift change. Please see his/her note for further details regarding further ED course and disposition. Plan at time of handoff is follow up on CT imaging and PO challenge, likely d/c to home.   Final Clinical Impression(s) / ED Diagnoses Final diagnoses:  None    Rx / DC Orders ED Discharge Orders     None      Portions of this report may have been transcribed using voice recognition software. Every effort was made to ensure accuracy; however, inadvertent computerized transcription errors may be present.    Zyanna Leisinger T, PA-C 08/14/22 2346    Jeanell Sparrow, DO 08/15/22 2043

## 2022-08-15 ENCOUNTER — Emergency Department (HOSPITAL_COMMUNITY): Payer: Medicaid Other

## 2022-08-15 DIAGNOSIS — R109 Unspecified abdominal pain: Secondary | ICD-10-CM | POA: Diagnosis not present

## 2022-08-15 MED ORDER — ONDANSETRON 4 MG PO TBDP: 4.0000 mg | ORAL_TABLET | Freq: Three times a day (TID) | ORAL | 0 refills | Status: DC | PRN

## 2022-08-15 MED ORDER — IOHEXOL 350 MG/ML SOLN
75.0000 mL | Freq: Once | INTRAVENOUS | Status: AC | PRN
  Administered 2022-08-15: 75 mL via INTRAVENOUS

## 2022-08-15 MED ORDER — FAMOTIDINE 20 MG PO TABS: 20.0000 mg | ORAL_TABLET | Freq: Two times a day (BID) | ORAL | 0 refills | Status: DC

## 2022-08-15 NOTE — ED Provider Notes (Signed)
Accepted handoff at shift change from LR PA-C. Please see prior provider note for more detail.   Briefly: Patient is 27 y.o.   "Kathleen Spears is a 27 y.o. female with history of migraines, asthma, sickle cell trait, herpes who presents the emergency department complaining of abdominal pain and heavy vaginal bleeding.  Patient states that he she started her menstrual cycle 4 days ago, and has had significant heavy bleeding with clots.  Has associated upper abdominal pain, nausea and vomiting.  Denies history of similar symptoms.  States she does have an OB/GYN.  Is concern for pregnancy, doubts concern for STI.  Denies dysuria, diarrhea or constipation, fever."    Plan: FU on CT - anticipate DC     Physical Exam  BP 116/73   Pulse 84   Temp 97.7 F (36.5 C) (Oral)   Resp 12   LMP 08/11/2022   SpO2 100%   Physical Exam  Procedures  Procedures Results for orders placed or performed during the hospital encounter of 08/14/22  CBC with Differential  Result Value Ref Range   WBC 5.7 4.0 - 10.5 K/uL   RBC 5.09 3.87 - 5.11 MIL/uL   Hemoglobin 13.9 12.0 - 15.0 g/dL   HCT 41.5 36.0 - 46.0 %   MCV 81.5 80.0 - 100.0 fL   MCH 27.3 26.0 - 34.0 pg   MCHC 33.5 30.0 - 36.0 g/dL   RDW 12.6 11.5 - 15.5 %   Platelets 303 150 - 400 K/uL   nRBC 0.0 0.0 - 0.2 %   Neutrophils Relative % 50 %   Neutro Abs 2.8 1.7 - 7.7 K/uL   Lymphocytes Relative 37 %   Lymphs Abs 2.1 0.7 - 4.0 K/uL   Monocytes Relative 11 %   Monocytes Absolute 0.6 0.1 - 1.0 K/uL   Eosinophils Relative 1 %   Eosinophils Absolute 0.1 0.0 - 0.5 K/uL   Basophils Relative 1 %   Basophils Absolute 0.0 0.0 - 0.1 K/uL   Immature Granulocytes 0 %   Abs Immature Granulocytes 0.02 0.00 - 0.07 K/uL  Comprehensive metabolic panel  Result Value Ref Range   Sodium 138 135 - 145 mmol/L   Potassium 3.8 3.5 - 5.1 mmol/L   Chloride 106 98 - 111 mmol/L   CO2 23 22 - 32 mmol/L   Glucose, Bld 92 70 - 99 mg/dL   BUN 10 6 - 20 mg/dL    Creatinine, Ser 0.70 0.44 - 1.00 mg/dL   Calcium 9.5 8.9 - 10.3 mg/dL   Total Protein 7.6 6.5 - 8.1 g/dL   Albumin 4.3 3.5 - 5.0 g/dL   AST 19 15 - 41 U/L   ALT 16 0 - 44 U/L   Alkaline Phosphatase 52 38 - 126 U/L   Total Bilirubin 0.6 0.3 - 1.2 mg/dL   GFR, Estimated >60 >60 mL/min   Anion gap 9 5 - 15  Lipase, blood  Result Value Ref Range   Lipase 44 11 - 51 U/L  Urinalysis, Routine w reflex microscopic -Urine, Clean Catch  Result Value Ref Range   Color, Urine YELLOW YELLOW   APPearance CLEAR CLEAR   Specific Gravity, Urine 1.021 1.005 - 1.030   pH 5.0 5.0 - 8.0   Glucose, UA NEGATIVE NEGATIVE mg/dL   Hgb urine dipstick LARGE (A) NEGATIVE   Bilirubin Urine NEGATIVE NEGATIVE   Ketones, ur NEGATIVE NEGATIVE mg/dL   Protein, ur NEGATIVE NEGATIVE mg/dL   Nitrite NEGATIVE NEGATIVE   Leukocytes,Ua  NEGATIVE NEGATIVE   RBC / HPF 0-5 0 - 5 RBC/hpf   WBC, UA 0-5 0 - 5 WBC/hpf   Bacteria, UA RARE (A) NONE SEEN   Squamous Epithelial / HPF 0-5 0 - 5 /HPF   Mucus PRESENT   Rapid urine drug screen (hospital performed)  Result Value Ref Range   Opiates NONE DETECTED NONE DETECTED   Cocaine NONE DETECTED NONE DETECTED   Benzodiazepines NONE DETECTED NONE DETECTED   Amphetamines NONE DETECTED NONE DETECTED   Tetrahydrocannabinol NONE DETECTED NONE DETECTED   Barbiturates NONE DETECTED NONE DETECTED  I-Stat beta hCG blood, ED  Result Value Ref Range   I-stat hCG, quantitative <5.0 <5 mIU/mL   Comment 3           No results found.  ED Course / MDM   Clinical Course as of 08/15/22 Danne Baxter Aug 14, 2022  2343 Heavy menstrual cycle 4 days ago.  Not pregnant.  NV and abd pain. Epigastric/periumbilical pain.  [WF]    Clinical Course User Index [WF] Tedd Sias, PA   Medical Decision Making Amount and/or Complexity of Data Reviewed Labs: ordered. Radiology: ordered.  Risk Prescription drug management.   Patient p.o. challenged and is ambulating, feeling  improved  Has no pain currently.  Abdominal exam is benign no abdominal tenderness.  No guarding or rebound.  Will discharge home with follow-up with her OB/GYN and recommend PCP follow-up as well.  Return precautions discussed.  CT abdomen pelvis without any acute findings.     Pati Gallo [place], [place] 08/15/22 0154    Maudie Flakes, MD 08/15/22 [phone]

## 2022-08-15 NOTE — Discharge Instructions (Addendum)
Please help with your OB/GYN I have also given you the information for the [facility] health wellness clinic.  Use Zofran as prescribed as needed for nausea.  Use Pepcid as prescribed.

## 2022-09-02 ENCOUNTER — Encounter (HOSPITAL_BASED_OUTPATIENT_CLINIC_OR_DEPARTMENT_OTHER): Payer: Self-pay | Admitting: Student

## 2022-09-02 ENCOUNTER — Ambulatory Visit (HOSPITAL_BASED_OUTPATIENT_CLINIC_OR_DEPARTMENT_OTHER): Payer: Medicaid Other

## 2022-09-02 ENCOUNTER — Ambulatory Visit (INDEPENDENT_AMBULATORY_CARE_PROVIDER_SITE_OTHER): Payer: Medicaid Other | Admitting: Student

## 2022-09-02 DIAGNOSIS — M79645 Pain in left finger(s): Secondary | ICD-10-CM | POA: Diagnosis not present

## 2022-09-02 NOTE — Progress Notes (Signed)
Chief Complaint: Left ring finger pain     History of Present Illness:    Kathleen Spears is a 27 y.o. female presenting to clinic for evaluation of pain and swelling in her left ring finger.  She states that this problem has been ongoing for years.  The pain is located toward the end of her ring finger and sometimes can be severe.  She reports that the pain and swelling are sometimes caused by hitting her finger on something or moving a certain way however sometimes it gets painful and swollen without any cause.  She tries to manage the pain with Tylenol which does help a little but mostly just tries to protect her finger.  No other fingers are affected.  No history of prior injury.  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    Surgical History:   None  PMH/PSH/Family History/Social History/Meds/Allergies:    Past Medical History:  Diagnosis Date   Anxiety attack    Asthma    Herpes    Migraines    Sickle-cell trait    Past Surgical History:  Procedure Laterality Date   TONSILLECTOMY     WISDOM TOOTH EXTRACTION     Social History   Socioeconomic History   Marital status: Single    Spouse name: Not on file   Number of children: Not on file   Years of education: Not on file   Highest education level: Not on file  Occupational History   Not on file  Tobacco Use   Smoking status: Never   Smokeless tobacco: Never  Vaping Use   Vaping Use: Never used  Substance and Sexual Activity   Alcohol use: No    Alcohol/week: 0.0 standard drinks of alcohol    Comment: social   Drug use: No   Sexual activity: Yes    Partners: Male    Birth control/protection: None  Other Topics Concern   Not on file  Social History Narrative   Not on file   Social Determinants of Health   Financial Resource Strain: Not on file  Food Insecurity: Not on file  Transportation Needs: Not on file  Physical Activity: Not on file  Stress: Not on file   Social Connections: Not on file   Family History  Problem Relation Age of Onset   Bone cancer Mother    Allergies  Allergen Reactions   Hydrocodone Nausea And Vomiting   Current Outpatient Medications  Medication Sig Dispense Refill   albuterol (VENTOLIN HFA) 108 (90 Base) MCG/ACT inhaler Inhale 1-2 puffs into the lungs every 6 (six) hours as needed for wheezing or shortness of breath. 18 g 0   cetirizine (ZYRTEC) 10 MG tablet Take 1 tablet (10 mg total) by mouth daily. 30 tablet 11   famotidine (PEPCID) 20 MG tablet Take 1 tablet (20 mg total) by mouth 2 (two) times daily. 30 tablet 0   fluticasone (FLONASE) 50 MCG/ACT nasal spray Place 2 sprays into both nostrils daily. 16 g 6   loperamide (IMODIUM A-D) 2 MG tablet Take 1 tablet (2 mg total) by mouth 4 (four) times daily as needed for diarrhea or loose stools. 30 tablet 0   metFORMIN (GLUCOPHAGE) 500 MG tablet Take by mouth.     montelukast (SINGULAIR) 10 MG tablet Take 1 tablet (10 mg  total) by mouth at bedtime. 30 tablet 0   ondansetron (ZOFRAN-ODT) 4 MG disintegrating tablet Take 1 tablet (4 mg total) by mouth every 8 (eight) hours as needed for nausea or vomiting. 20 tablet 0   No current facility-administered medications for this visit.   No results found.  Review of Systems:   A ROS was performed including pertinent positives and negatives as documented in the HPI.  Physical Exam :   Constitutional: NAD and appears stated age Neurological: Alert and oriented Psych: Appropriate affect and cooperative Last menstrual period 08/11/2022.   Comprehensive Musculoskeletal Exam:    No tenderness to palpation throughout the ring finger.  Unable to appreciate any edema or erythema.  Able to actively flex and extend at the MCP PIP and DIP of the ring finger.  Normal and equal grip strength bilaterally.  Capillary refill <2 seconds.  Negative Tinel's test.  Patient does notice some differences in sensation in the distal ring finger  with slight numbness noted on the medial aspect.  Normal sensation in the pinky finger and ulnar nerve distribution of the hand.  Imaging:   Xray (Left ring finger 3 views): Negative   I personally reviewed and interpreted the radiographs.   Assessment:   27 y.o. female with chronic and atraumatic pain/swelling of the left ring finger.  X-rays do not show any abnormalities and the finger does not currently appear swollen.  I would like to rule out any type of inflammatory arthritis that could be causing this.  I will get her referred over to rheumatology for workup.  If this is negative, I suspect this is due to some type of nerve hypersensitivity as her symptoms are not consistent with any type of tendinopathy.  Will plan to see her back as needed.  All other questions and concerns addressed.  Plan :    - Referral to rheumatology and follow up as needed     I personally saw and evaluated the patient, and participated in the management and treatment plan.  Hazle Nordmann, PA-C Orthopedics  This document was dictated using Conservation officer, historic buildings. A reasonable attempt at proof reading has been made to minimize errors.

## 2022-09-05 DIAGNOSIS — R1012 Left upper quadrant pain: Secondary | ICD-10-CM | POA: Diagnosis not present

## 2022-09-05 DIAGNOSIS — K5900 Constipation, unspecified: Secondary | ICD-10-CM | POA: Diagnosis not present

## 2022-11-10 ENCOUNTER — Telehealth: Payer: 59 | Admitting: Physician Assistant

## 2022-11-10 DIAGNOSIS — B3731 Acute candidiasis of vulva and vagina: Secondary | ICD-10-CM

## 2022-11-10 MED ORDER — FLUCONAZOLE 150 MG PO TABS: ORAL_TABLET | ORAL | 0 refills | Status: DC

## 2022-11-10 NOTE — Patient Instructions (Signed)
Kathleen Spears, thank you for joining [name], PA-C for today's virtual visit.  While this provider is not your primary care provider (PCP), if your PCP is located in our provider database this encounter information will be shared with them immediately following your visit.   A [facility] MyChart account gives you access to today's visit and all your visits, tests, and labs performed at [facility] " click here if you don't have a [facility] MyChart account or go to [email or web address]  Consent: (Patient) Kathleen Spears provided verbal consent for this virtual visit at the beginning of the encounter.  Current Medications:  Current Outpatient Medications:    albuterol (VENTOLIN HFA) 108 (90 Base) MCG/ACT inhaler, Inhale 1-2 puffs into the lungs every 6 (six) hours as needed for wheezing or shortness of breath., Disp: 18 g, Rfl: 0   cetirizine (ZYRTEC) 10 MG tablet, Take 1 tablet (10 mg total) by mouth daily., Disp: 30 tablet, Rfl: 11   famotidine (PEPCID) 20 MG tablet, Take 1 tablet (20 mg total) by mouth 2 (two) times daily., Disp: 30 tablet, Rfl: 0   fluticasone (FLONASE) 50 MCG/ACT nasal spray, Place 2 sprays into both nostrils daily., Disp: 16 g, Rfl: 6   loperamide (IMODIUM A-D) 2 MG tablet, Take 1 tablet (2 mg total) by mouth 4 (four) times daily as needed for diarrhea or loose stools., Disp: 30 tablet, Rfl: 0   metFORMIN (GLUCOPHAGE) 500 MG tablet, Take by mouth., Disp: , Rfl:    montelukast (SINGULAIR) 10 MG tablet, Take 1 tablet (10 mg total) by mouth at bedtime., Disp: 30 tablet, Rfl: 0   ondansetron (ZOFRAN-ODT) 4 MG disintegrating tablet, Take 1 tablet (4 mg total) by mouth every 8 (eight) hours as needed for nausea or vomiting., Disp: 20 tablet, Rfl: 0   Medications ordered in this encounter:  No orders of the defined types were placed in this encounter.    *If you need refills on other medications prior to your next appointment, please contact  your pharmacy*  Follow-Up: Call back or seek an in-person evaluation if the symptoms worsen or if the condition fails to improve as anticipated.  [facility] [phone]  Other Instructions Vaginal Yeast Infection, Adult  Vaginal yeast infection is a condition that causes vaginal discharge as well as soreness, swelling, and redness (inflammation) of the vagina. This is a common condition. Some women get this infection frequently. What are the causes? This condition is caused by a change in the normal balance of the yeast (Candida) and normal bacteria that live in the vagina. This change causes an overgrowth of yeast, which causes the inflammation. What increases the risk? The condition is more likely to develop in women who: Take antibiotic medicines. Have diabetes. Take birth control pills. Are pregnant. Douche often. Have a weak body defense system (immune system). Have been taking steroid medicines for a long time. Frequently wear tight clothing. What are the signs or symptoms? Symptoms of this condition include: White, thick, creamy vaginal discharge. Swelling, itching, redness, and irritation of the vagina. The lips of the vagina (labia) may be affected as well. Pain or a burning feeling while urinating. Pain during sex. How is this diagnosed? This condition is diagnosed based on: Your medical history. A physical exam. A pelvic exam. Your health care provider will examine a sample of your vaginal discharge under a microscope. Your health care provider may send this sample for testing to confirm the diagnosis. How is  this treated? This condition is treated with medicine. Medicines may be over-the-counter or prescription. You may be told to use one or more of the following: Medicine that is taken by mouth (orally). Medicine that is applied as a cream (topically). Medicine that is inserted directly into the vagina (suppository). Follow these instructions at  home: Take or apply over-the-counter and prescription medicines only as told by your health care provider. Do not use tampons until your health care provider approves. Do not have sex until your infection has cleared. Sex can prolong or worsen your symptoms of infection. Ask your health care provider when it is safe to resume sexual activity. Keep all follow-up visits. This is important. How is this prevented?  Do not wear tight clothes, such as pantyhose or tight pants. Wear breathable cotton underwear. Do not use douches, perfumed soap, creams, or powders. Wipe from front to back after using the toilet. If you have diabetes, keep your blood sugar levels under control. Ask your health care provider for other ways to prevent yeast infections. Contact a health care provider if: You have a fever. Your symptoms go away and then return. Your symptoms do not get better with treatment. Your symptoms get worse. You have new symptoms. You develop blisters in or around your vagina. You have blood coming from your vagina and it is not your menstrual period. You develop pain in your abdomen. Summary Vaginal yeast infection is a condition that causes discharge as well as soreness, swelling, and redness (inflammation) of the vagina. This condition is treated with medicine. Medicines may be over-the-counter or prescription. Take or apply over-the-counter and prescription medicines only as told by your health care provider. Do not douche. Resume sexual activity or use of tampons as instructed by your health care provider. Contact a health care provider if your symptoms do not get better with treatment or your symptoms go away and then return. This information is not intended to replace advice given to you by your health care provider. Make sure you discuss any questions you have with your health care provider. Document Revised: 07/20/2020 Document Reviewed: 07/20/2020 Elsevier Patient Education  2024  Elsevier Inc.    If you have been instructed to have an in-person evaluation today at a local Urgent Care facility, please use the link below. It will take you to a list of all of our available [facility], including address, phone number and hours of operation. Please do not delay care.  [facility]  If you or a family member do not have a primary care provider, use the link below to schedule a visit and establish care. When you choose a [facility] primary care physician or advanced practice provider, you gain a long-term partner in health. Find a Primary Care Provider  Learn more about [facility]'s in-office and virtual care options:  - Get Care Now

## 2022-11-10 NOTE — Progress Notes (Signed)
Virtual Visit Consent   Kathleen Spears, you are scheduled for a virtual visit with a [facility] provider today. Just as with appointments in the office, your consent must be obtained to participate. Your consent will be active for this visit and any virtual visit you may have with one of our providers in the next 365 days. If you have a MyChart account, a copy of this consent can be sent to you electronically.  As this is a virtual visit, video technology does not allow for your provider to perform a traditional examination. This may limit your provider's ability to fully assess your condition. If your provider identifies any concerns that need to be evaluated in person or the need to arrange testing (such as labs, EKG, etc.), we will make arrangements to do so. Although advances in technology are sophisticated, we cannot ensure that it will always work on either your end or our end. If the connection with a video visit is poor, the visit may have to be switched to a telephone visit. With either a video or telephone visit, we are not always able to ensure that we have a secure connection.  By engaging in this virtual visit, you consent to the provision of healthcare and authorize for your insurance to be billed (if applicable) for the services provided during this visit. Depending on your insurance coverage, you may receive a charge related to this service.  I need to obtain your verbal consent now. Are you willing to proceed with your visit today? Orrie MARVELINE PROFETA has provided verbal consent on 11/10/2022 for a virtual visit (video or telephone). Kathleen Spears, [place]  Date: 11/10/2022 8:33 AM  Virtual Visit via Video Note   I, Kathleen Spears, connected with  [name]  ([id number], [birth year]) on 11/10/22 at  8:15 AM EDT by a video-enabled telemedicine application and verified that I am speaking with the correct person using two identifiers.  Location: Patient: Virtual Visit  Location Patient: Home Provider: Virtual Visit Location Provider: Home Office   I discussed the limitations of evaluation and management by telemedicine and the availability of in person appointments. The patient expressed understanding and agreed to proceed.    History of Present Illness: Kathleen Spears is a 27 y.o. who identifies as a female who was assigned female at birth, and is being seen today for concern of a possible yeast infection. Notes recently changing feminine washes noting irritation, itching and thick white discharge. Some occasional urinary urgency with this but no other changes to regular urinary habits. Denies fever, chills. Sexually active with one partner. No concern for STI or pregnancy.[name]   HPI: HPI  Problems:  Patient Active Problem List   Diagnosis Date Noted   Sore throat 09/02/2020   Sickle cell trait (HCC) 07/31/2019   GERD (gastroesophageal reflux disease) 07/31/2019   Anxiety 06/19/2019   Asthma 07/08/2015   Migraine without aura and without status migrainosus, not intractable 05/02/2014    Allergies:  Allergies  Allergen Reactions   Hydrocodone Nausea And Vomiting   Medications:  Current Outpatient Medications:    fluconazole (DIFLUCAN) 150 MG tablet, Take 1 tablet PO once. Repeat in 3 days if needed., Disp: 2 tablet, Rfl: 0   albuterol (VENTOLIN HFA) 108 (90 Base) MCG/ACT inhaler, Inhale 1-2 puffs into the lungs every 6 (six) hours as needed for wheezing or shortness of breath., Disp: 18 g, Rfl: 0   cetirizine (ZYRTEC) 10 MG tablet, Take 1 tablet (10 mg  total) by mouth daily., Disp: 30 tablet, Rfl: 11   famotidine (PEPCID) 20 MG tablet, Take 1 tablet (20 mg total) by mouth 2 (two) times daily., Disp: 30 tablet, Rfl: 0   fluticasone (FLONASE) 50 MCG/ACT nasal spray, Place 2 sprays into both nostrils daily., Disp: 16 g, Rfl: 6   loperamide (IMODIUM A-D) 2 MG tablet, Take 1 tablet (2 mg total) by mouth 4 (four) times daily as needed for diarrhea or loose  stools., Disp: 30 tablet, Rfl: 0   metFORMIN (GLUCOPHAGE) 500 MG tablet, Take by mouth., Disp: , Rfl:    montelukast (SINGULAIR) 10 MG tablet, Take 1 tablet (10 mg total) by mouth at bedtime., Disp: 30 tablet, Rfl: 0   ondansetron (ZOFRAN-ODT) 4 MG disintegrating tablet, Take 1 tablet (4 mg total) by mouth every 8 (eight) hours as needed for nausea or vomiting., Disp: 20 tablet, Rfl: 0  Observations/Objective: Patient is well-developed, well-nourished in no acute distress.  Resting comfortably at home.  Head is normocephalic, atraumatic.  No labored breathing. Speech is clear and coherent with logical content.  Patient is alert and oriented at baseline.   Assessment and Plan: 1. Yeast vaginitis - fluconazole (DIFLUCAN) 150 MG tablet; Take 1 tablet PO once. Repeat in 3 days if needed.  Dispense: 2 tablet; Refill: 0  Supportive measures discussed. Stop use of new product. Diflucan per orders. In person evaluation recommended for any non-resolving, new or worsening symptoms despite treatment.   Follow Up Instructions: I discussed the assessment and treatment plan with the patient. The patient was provided an opportunity to ask questions and all were answered. The patient agreed with the plan and demonstrated an understanding of the instructions.  A copy of instructions were sent to the patient via MyChart unless otherwise noted below.    The patient was advised to call back or seek an in-person evaluation if the symptoms worsen or if the condition fails to improve as anticipated.  Time:  I spent 10 minutes with the patient via telehealth technology discussing the above problems/concerns.    Kathleen Climes, PA-C

## 2022-11-17 ENCOUNTER — Telehealth: Payer: 59 | Admitting: Physician Assistant

## 2022-11-17 DIAGNOSIS — J4520 Mild intermittent asthma, uncomplicated: Secondary | ICD-10-CM | POA: Diagnosis not present

## 2022-11-17 DIAGNOSIS — R3989 Other symptoms and signs involving the genitourinary system: Secondary | ICD-10-CM

## 2022-11-17 MED ORDER — CEPHALEXIN 500 MG PO CAPS: 500.0000 mg | ORAL_CAPSULE | Freq: Two times a day (BID) | ORAL | 0 refills | Status: DC

## 2022-11-17 MED ORDER — ALBUTEROL SULFATE HFA 108 (90 BASE) MCG/ACT IN AERS: 1.0000 | INHALATION_SPRAY | Freq: Four times a day (QID) | RESPIRATORY_TRACT | 0 refills | Status: DC | PRN

## 2022-11-17 NOTE — Patient Instructions (Signed)
Kathleen Spears, thank you for joining Margaretann Loveless, PA-C for today's virtual visit.  While this provider is not your primary care provider (PCP), if your PCP is located in our provider database this encounter information will be shared with them immediately following your visit.   A [facility] MyChart account gives you access to today's visit and all your visits, tests, and labs performed at [facility] " click here if you don't have a [name] or go to [email or web address]  Consent: (Patient) Kathleen Spears provided verbal consent for this virtual visit at the beginning of the encounter.  Current Medications:  Current Outpatient Medications:    albuterol (VENTOLIN HFA) 108 (90 Base) MCG/ACT inhaler, Inhale 1-2 puffs into the lungs every 6 (six) hours as needed for wheezing or shortness of breath., Disp: 18 g, Rfl: 0   cephALEXin (KEFLEX) 500 MG capsule, Take 1 capsule (500 mg total) by mouth 2 (two) times daily., Disp: 14 capsule, Rfl: 0   cetirizine (ZYRTEC) 10 MG tablet, Take 1 tablet (10 mg total) by mouth daily., Disp: 30 tablet, Rfl: 11   famotidine (PEPCID) 20 MG tablet, Take 1 tablet (20 mg total) by mouth 2 (two) times daily., Disp: 30 tablet, Rfl: 0   fluconazole (DIFLUCAN) 150 MG tablet, Take 1 tablet PO once. Repeat in 3 days if needed., Disp: 2 tablet, Rfl: 0   fluticasone (FLONASE) 50 MCG/ACT nasal spray, Place 2 sprays into both nostrils daily., Disp: 16 g, Rfl: 6   loperamide (IMODIUM A-D) 2 MG tablet, Take 1 tablet (2 mg total) by mouth 4 (four) times daily as needed for diarrhea or loose stools., Disp: 30 tablet, Rfl: 0   metFORMIN (GLUCOPHAGE) 500 MG tablet, Take by mouth., Disp: , Rfl:    montelukast (SINGULAIR) 10 MG tablet, Take 1 tablet (10 mg total) by mouth at bedtime., Disp: 30 tablet, Rfl: 0   ondansetron (ZOFRAN-ODT) 4 MG disintegrating tablet, Take 1 tablet (4 mg total) by mouth every 8 (eight) hours as needed for nausea  or vomiting., Disp: 20 tablet, Rfl: 0   Medications ordered in this encounter:  Meds ordered this encounter  Medications   cephALEXin (KEFLEX) 500 MG capsule    Sig: Take 1 capsule (500 mg total) by mouth 2 (two) times daily.    Dispense:  14 capsule    Refill:  0    Order Specific Question:   Supervising Provider    Answer:   Loreli Dollar   albuterol (VENTOLIN HFA) 108 (90 Base) MCG/ACT inhaler    Sig: Inhale 1-2 puffs into the lungs every 6 (six) hours as needed for wheezing or shortness of breath.    Dispense:  18 g    Refill:  0    Order Specific Question:   Supervising Provider    Answer:   Merrilee Jansky [id number]     *If you need refills on other medications prior to your next appointment, please contact your pharmacy*  Follow-Up: Call back or seek an in-person evaluation if the symptoms worsen or if the condition fails to improve as anticipated.   Virtual Care [phone]  Other Instructions Urinary Tract Infection, Adult  A urinary tract infection (UTI) is an infection of any part of the urinary tract. The urinary tract includes the kidneys, ureters, bladder, and urethra. These organs make, store, and get rid of urine in the body. An upper UTI affects the ureters and kidneys. A lower UTI affects  the bladder and urethra. What are the causes? Most urinary tract infections are caused by bacteria in your genital area around your urethra, where urine leaves your body. These bacteria grow and cause inflammation of your urinary tract. What increases the risk? You are more likely to develop this condition if: You have a urinary catheter that stays in place. You are not able to control when you urinate or have a bowel movement (incontinence). You are female and you: Use a spermicide or diaphragm for birth control. Have low estrogen levels. Are pregnant. You have certain genes that increase your risk. You are sexually active. You take  antibiotic medicines. You have a condition that causes your flow of urine to slow down, such as: An enlarged prostate, if you are female. Blockage in your urethra. A kidney stone. A nerve condition that affects your bladder control (neurogenic bladder). Not getting enough to drink, or not urinating often. You have certain medical conditions, such as: Diabetes. A weak disease-fighting system (immunesystem). Sickle cell disease. Gout. Spinal cord injury. What are the signs or symptoms? Symptoms of this condition include: Needing to urinate right away (urgency). Frequent urination. This may include small amounts of urine each time you urinate. Pain or burning with urination. Blood in the urine. Urine that smells bad or unusual. Trouble urinating. Cloudy urine. Vaginal discharge, if you are female. Pain in the abdomen or the lower back. You may also have: Vomiting or a decreased appetite. Confusion. Irritability or tiredness. A fever or chills. Diarrhea. The first symptom in older adults may be confusion. In some cases, they may not have any symptoms until the infection has worsened. How is this diagnosed? This condition is diagnosed based on your medical history and a physical exam. You may also have other tests, including: Urine tests. Blood tests. Tests for STIs (sexually transmitted infections). If you have had more than one UTI, a cystoscopy or imaging studies may be done to determine the cause of the infections. How is this treated? Treatment for this condition includes: Antibiotic medicine. Over-the-counter medicines to treat discomfort. Drinking enough water to stay hydrated. If you have frequent infections or have other conditions such as a kidney stone, you may need to see a health care provider who specializes in the urinary tract (urologist). In rare cases, urinary tract infections can cause sepsis. Sepsis is a life-threatening condition that occurs when the body  responds to an infection. Sepsis is treated in the hospital with IV antibiotics, fluids, and other medicines. Follow these instructions at home:  Medicines Take over-the-counter and prescription medicines only as told by your health care provider. If you were prescribed an antibiotic medicine, take it as told by your health care provider. Do not stop using the antibiotic even if you start to feel better. General instructions Make sure you: Empty your bladder often and completely. Do not hold urine for long periods of time. Empty your bladder after sex. Wipe from front to back after urinating or having a bowel movement if you are female. Use each tissue only one time when you wipe. Drink enough fluid to keep your urine pale yellow. Keep all follow-up visits. This is important. Contact a health care provider if: Your symptoms do not get better after 1-2 days. Your symptoms go away and then return. Get help right away if: You have severe pain in your back or your lower abdomen. You have a fever or chills. You have nausea or vomiting. Summary A urinary tract infection (UTI) is  an infection of any part of the urinary tract, which includes the kidneys, ureters, bladder, and urethra. Most urinary tract infections are caused by bacteria in your genital area. Treatment for this condition often includes antibiotic medicines. If you were prescribed an antibiotic medicine, take it as told by your health care provider. Do not stop using the antibiotic even if you start to feel better. Keep all follow-up visits. This is important. This information is not intended to replace advice given to you by your health care provider. Make sure you discuss any questions you have with your health care provider. Document Revised: 12/08/2019 Document Reviewed: 12/13/2019 Elsevier Patient Education  2024 Elsevier Inc.    If you have been instructed to have an in-person evaluation today at a local Urgent Care  facility, please use the link below. It will take you to a list of all of our available [facility], including address, phone number and hours of operation. Please do not delay care.  [facility]  If you or a family member do not have a primary care provider, use the link below to schedule a visit and establish care. When you choose a [facility] primary care physician or advanced practice provider, you gain a long-term partner in health. Find a Primary Care Provider  Learn more about [facility] in-office and virtual care options:  - Get Care Now

## 2022-11-17 NOTE — Progress Notes (Signed)
Virtual Visit Consent   Kathleen Spears, you are scheduled for a virtual visit with a [facility] provider today. Just as with appointments in the office, your consent must be obtained to participate. Your consent will be active for this visit and any virtual visit you may have with one of our providers in the next 365 days. If you have a MyChart account, a copy of this consent can be sent to you electronically.  As this is a virtual visit, video technology does not allow for your provider to perform a traditional examination. This may limit your provider's ability to fully assess your condition. If your provider identifies any concerns that need to be evaluated in person or the need to arrange testing (such as labs, EKG, etc.), we will make arrangements to do so. Although advances in technology are sophisticated, we cannot ensure that it will always work on either your end or our end. If the connection with a video visit is poor, the visit may have to be switched to a telephone visit. With either a video or telephone visit, we are not always able to ensure that we have a secure connection.  By engaging in this virtual visit, you consent to the provision of healthcare and authorize for your insurance to be billed (if applicable) for the services provided during this visit. Depending on your insurance coverage, you may receive a charge related to this service.  I need to obtain your verbal consent now. Are you willing to proceed with your visit today? Kathleen Spears has provided verbal consent on 11/17/2022 for a virtual visit (video or telephone). Kathleen Loveless, PA-C  Date: 11/17/2022 10:53 AM  Virtual Visit via Video Note   I, Kathleen Spears, connected with  Kathleen Spears  ([id number], [birth year]) on 11/17/22 at 10:45 AM EDT by a video-enabled telemedicine application and verified that I am speaking with the correct person using two identifiers.  Location: Patient: Virtual Visit Location  Patient: Home Provider: Virtual Visit Location Provider: Home Office   I discussed the limitations of evaluation and management by telemedicine and the availability of in person appointments. The patient expressed understanding and agreed to proceed.    History of Present Illness: Kathleen Spears is a 27 y.o. who identifies as a female who was assigned female at birth, and is being seen today for foul odor and urinary symptoms with increased fatigue.  HPI: Vaginal Discharge The patient's primary symptoms include genital itching (improved after fluconazole) and a genital odor (only with urination). The patient's pertinent negatives include no pelvic pain or vaginal discharge. This is a new problem. The current episode started 1 to 4 weeks ago. The problem occurs constantly. The problem has been gradually worsening. The pain is mild. Associated symptoms include dysuria, frequency, hematuria and urgency. Pertinent negatives include no chills, constipation, diarrhea, discolored urine, fever, flank pain, headaches, nausea or painful intercourse. The vaginal discharge was normal. The vaginal bleeding is typical of menses (had spotting 10/25/22; Started menses 11/13/22 and stopped 11/15/22; but restarted on 11/16/22 and 11/17/22). She has not been passing clots. She has not been passing tissue. Nothing aggravates the symptoms. She has tried antifungals for the symptoms. The treatment provided moderate relief. She is sexually active. No, her partner does not have an STD. Her menstrual history has been regular.  Did have more itching initially and completed a video visit on 11/10/22. Was given Fluconazole and took both doses. Had itching and the external discomfort improve.  However, still having urinary symptoms and a foul odor that is only present with urination.   Has been trying to get pregnant, but has not taken a pregnancy test.  Also requesting Albuterol refill.  Problems:  Patient Active Problem List    Diagnosis Date Noted   Sore throat 09/02/2020   Sickle cell trait (HCC) 07/31/2019   GERD (gastroesophageal reflux disease) 07/31/2019   Anxiety 06/19/2019   Asthma 07/08/2015   Migraine without aura and without status migrainosus, not intractable 05/02/2014    Allergies:  Allergies  Allergen Reactions   Hydrocodone Nausea And Vomiting   Medications:  Current Outpatient Medications:    albuterol (VENTOLIN HFA) 108 (90 Base) MCG/ACT inhaler, Inhale 1-2 puffs into the lungs every 6 (six) hours as needed for wheezing or shortness of breath., Disp: 18 g, Rfl: 0   cephALEXin (KEFLEX) 500 MG capsule, Take 1 capsule (500 mg total) by mouth 2 (two) times daily., Disp: 14 capsule, Rfl: 0   cetirizine (ZYRTEC) 10 MG tablet, Take 1 tablet (10 mg total) by mouth daily., Disp: 30 tablet, Rfl: 11   famotidine (PEPCID) 20 MG tablet, Take 1 tablet (20 mg total) by mouth 2 (two) times daily., Disp: 30 tablet, Rfl: 0   fluconazole (DIFLUCAN) 150 MG tablet, Take 1 tablet PO once. Repeat in 3 days if needed., Disp: 2 tablet, Rfl: 0   fluticasone (FLONASE) 50 MCG/ACT nasal spray, Place 2 sprays into both nostrils daily., Disp: 16 g, Rfl: 6   loperamide (IMODIUM A-D) 2 MG tablet, Take 1 tablet (2 mg total) by mouth 4 (four) times daily as needed for diarrhea or loose stools., Disp: 30 tablet, Rfl: 0   metFORMIN (GLUCOPHAGE) 500 MG tablet, Take by mouth., Disp: , Rfl:    montelukast (SINGULAIR) 10 MG tablet, Take 1 tablet (10 mg total) by mouth at bedtime., Disp: 30 tablet, Rfl: 0   ondansetron (ZOFRAN-ODT) 4 MG disintegrating tablet, Take 1 tablet (4 mg total) by mouth every 8 (eight) hours as needed for nausea or vomiting., Disp: 20 tablet, Rfl: 0  Observations/Objective: Patient is well-developed, well-nourished in no acute distress.  Resting comfortably at home.  Head is normocephalic, atraumatic.  No labored breathing.  Speech is clear and coherent with logical content.  Patient is alert and  oriented at baseline.    Assessment and Plan: 1. Suspected UTI - cephALEXin (KEFLEX) 500 MG capsule; Take 1 capsule (500 mg total) by mouth 2 (two) times daily.  Dispense: 14 capsule; Refill: 0  2. Mild intermittent asthma without complication - albuterol (VENTOLIN HFA) 108 (90 Base) MCG/ACT inhaler; Inhale 1-2 puffs into the lungs every 6 (six) hours as needed for wheezing or shortness of breath.  Dispense: 18 g; Refill: 0  - Worsening symptoms.  - Will treat empirically with Keflex (possible pregnancy; advised to take OTC pregnancy test) - May use AZO for bladder spasms - Continue to push fluids.  - Seek in person evaluation for urine culture if symptoms do not improve or if they worsen.    Follow Up Instructions: I discussed the assessment and treatment plan with the patient. The patient was provided an opportunity to ask questions and all were answered. The patient agreed with the plan and demonstrated an understanding of the instructions.  A copy of instructions were sent to the patient via MyChart unless otherwise noted below.    The patient was advised to call back or seek an in-person evaluation if the symptoms worsen or if the condition fails  to improve as anticipated.  Time:  I spent 12 minutes with the patient via telehealth technology discussing the above problems/concerns.    Mar Daring, PA-C

## 2022-11-24 DIAGNOSIS — M79672 Pain in left foot: Secondary | ICD-10-CM | POA: Diagnosis not present

## 2022-12-18 NOTE — Progress Notes (Unsigned)
Office Visit Note  Patient: Kathleen Spears             Date of Birth: 1995/08/06           MRN: [id number]             PCP: Pcp, No Referring: Amador Cunas, PA* Visit Date: 12/19/2022 Occupation: @GUAROCC @  Subjective:  No chief complaint on file.   History of Present Illness: Kathleen Spears is a 27 y.o. female here for evaluation of recurrent finger pain and swelling affecting her left 4th finger. Previous evaluation including normal xray and orthopedic clinic exam not suggestive of underlying tendinopathy. ***     Activities of Daily Living:  Patient reports morning stiffness for *** {minute/hour:19697}.   Patient {ACTIONS;DENIES/REPORTS:21021675::"Denies"} nocturnal pain.  Difficulty dressing/grooming: {ACTIONS;DENIES/REPORTS:21021675::"Denies"} Difficulty climbing stairs: {ACTIONS;DENIES/REPORTS:21021675::"Denies"} Difficulty getting out of chair: {ACTIONS;DENIES/REPORTS:21021675::"Denies"} Difficulty using hands for taps, buttons, cutlery, and/or writing: {ACTIONS;DENIES/REPORTS:21021675::"Denies"}  No Rheumatology ROS completed.   PMFS History:  Patient Active Problem List   Diagnosis Date Noted   Sore throat 09/02/2020   Sickle cell trait (HCC) 07/31/2019   GERD (gastroesophageal reflux disease) 07/31/2019   Anxiety 06/19/2019   Asthma 07/08/2015   Migraine without aura and without status migrainosus, not intractable 05/02/2014    Past Medical History:  Diagnosis Date   Anxiety attack    Asthma    Herpes    Migraines    Sickle-cell trait (HCC)     Family History  Problem Relation Age of Onset   Bone cancer Mother    Past Surgical History:  Procedure Laterality Date   TONSILLECTOMY     WISDOM TOOTH EXTRACTION     Social History   Social History Narrative   Not on file   Immunization History  Administered Date(s) Administered   Influenza,inj,Quad PF,6+ Mos 03/28/2016   Moderna Sars-Covid-2 Vaccination 07/23/2019, 09/04/2019   PPD Test  02/27/2017   Tdap 03/28/2016     Objective: Vital Signs: There were no vitals taken for this visit.   Physical Exam   Musculoskeletal Exam: ***  CDAI Exam: CDAI Score: -- Patient Global: --; Provider Global: -- Swollen: --; Tender: -- Joint Exam 12/19/2022   No joint exam has been documented for this visit   There is currently no information documented on the homunculus. Go to the Rheumatology activity and complete the homunculus joint exam.  Investigation: No additional findings.  Imaging: No results found.  Recent Labs: Lab Results  Component Value Date   WBC 5.7 08/14/2022   HGB 13.9 08/14/2022   PLT 303 08/14/2022   NA 138 08/14/2022   K 3.8 08/14/2022   CL 106 08/14/2022   CO2 23 08/14/2022   GLUCOSE 92 08/14/2022   BUN 10 08/14/2022   CREATININE 0.70 08/14/2022   BILITOT 0.6 08/14/2022   ALKPHOS 52 08/14/2022   AST 19 08/14/2022   ALT 16 08/14/2022   PROT 7.6 08/14/2022   ALBUMIN 4.3 08/14/2022   CALCIUM 9.5 08/14/2022   GFRAA >60 11/21/2019    Speciality Comments: No specialty comments available.  Procedures:  No procedures performed Allergies: Hydrocodone   Assessment / Plan:     Visit Diagnoses: No diagnosis found.  Orders: No orders of the defined types were placed in this encounter.  No orders of the defined types were placed in this encounter.   Face-to-face time spent with patient was *** minutes. Greater than 50% of time was spent in counseling and coordination of care.  Follow-Up Instructions: No  follow-ups on file.   Fuller Plan, MD  Note - This record has been created using [employer].  Chart creation errors have been sought, but may not always  have been located. Such creation errors do not reflect on  the standard of medical care.

## 2022-12-19 ENCOUNTER — Ambulatory Visit: Payer: 59 | Attending: Internal Medicine | Admitting: Internal Medicine

## 2022-12-19 ENCOUNTER — Encounter: Payer: Self-pay | Admitting: Internal Medicine

## 2022-12-19 VITALS — BP 106/67 | HR 73 | Resp 12 | Ht 62.0 in | Wt 124.0 lb

## 2022-12-19 DIAGNOSIS — M79645 Pain in left finger(s): Secondary | ICD-10-CM | POA: Diagnosis not present

## 2023-03-18 ENCOUNTER — Ambulatory Visit (INDEPENDENT_AMBULATORY_CARE_PROVIDER_SITE_OTHER): Payer: 59

## 2023-03-18 ENCOUNTER — Encounter (HOSPITAL_COMMUNITY): Payer: Self-pay

## 2023-03-18 ENCOUNTER — Emergency Department (HOSPITAL_COMMUNITY): Payer: 59

## 2023-03-18 ENCOUNTER — Other Ambulatory Visit: Payer: Self-pay

## 2023-03-18 ENCOUNTER — Ambulatory Visit (HOSPITAL_COMMUNITY)
Admission: EM | Admit: 2023-03-18 | Discharge: 2023-03-18 | Disposition: A | Discharge status: Home / Self Care | Payer: 59 | Attending: Emergency Medicine | Admitting: Emergency Medicine

## 2023-03-18 ENCOUNTER — Emergency Department (HOSPITAL_COMMUNITY)
Admission: EM | Admit: 2023-03-18 | Discharge: 2023-03-18 | Disposition: A | Discharge status: Home / Self Care | Payer: 59 | Attending: Emergency Medicine | Admitting: Emergency Medicine

## 2023-03-18 DIAGNOSIS — E876 Hypokalemia: Secondary | ICD-10-CM

## 2023-03-18 DIAGNOSIS — M79642 Pain in left hand: Secondary | ICD-10-CM

## 2023-03-18 DIAGNOSIS — R519 Headache, unspecified: Secondary | ICD-10-CM | POA: Diagnosis not present

## 2023-03-18 DIAGNOSIS — M7989 Other specified soft tissue disorders: Secondary | ICD-10-CM | POA: Diagnosis not present

## 2023-03-18 DIAGNOSIS — R202 Paresthesia of skin: Secondary | ICD-10-CM | POA: Diagnosis not present

## 2023-03-18 DIAGNOSIS — R200 Anesthesia of skin: Secondary | ICD-10-CM

## 2023-03-18 DIAGNOSIS — M47812 Spondylosis without myelopathy or radiculopathy, cervical region: Secondary | ICD-10-CM | POA: Diagnosis not present

## 2023-03-18 DIAGNOSIS — G629 Polyneuropathy, unspecified: Secondary | ICD-10-CM

## 2023-03-18 DIAGNOSIS — M50222 Other cervical disc displacement at C5-C6 level: Secondary | ICD-10-CM | POA: Diagnosis not present

## 2023-03-18 LAB — CBC WITH DIFFERENTIAL/PLATELET
Abs Immature Granulocytes: 0.02 10*3/uL (ref 0.00–0.07)
Basophils Absolute: 0 10*3/uL (ref 0.0–0.1)
Basophils Relative: 1 %
Eosinophils Absolute: 0.1 10*3/uL (ref 0.0–0.5)
Eosinophils Relative: 2 %
HCT: 40.1 % (ref 36.0–46.0)
Hemoglobin: 13.7 g/dL (ref 12.0–15.0)
Immature Granulocytes: 0 %
Lymphocytes Relative: 37 %
Lymphs Abs: 2.1 10*3/uL (ref 0.7–4.0)
MCH: 27.5 pg (ref 26.0–34.0)
MCHC: 34.2 g/dL (ref 30.0–36.0)
MCV: 80.5 fL (ref 80.0–100.0)
Monocytes Absolute: 0.5 10*3/uL (ref 0.1–1.0)
Monocytes Relative: 9 %
Neutro Abs: 2.9 10*3/uL (ref 1.7–7.7)
Neutrophils Relative %: 51 %
Platelets: 234 10*3/uL (ref 150–400)
RBC: 4.98 MIL/uL (ref 3.87–5.11)
RDW: 12.5 % (ref 11.5–15.5)
WBC: 5.7 10*3/uL (ref 4.0–10.5)
nRBC: 0 % (ref 0.0–0.2)

## 2023-03-18 LAB — I-STAT CHEM 8, ED
BUN: 11 mg/dL (ref 6–20)
Calcium, Ion: 1.2 mmol/L (ref 1.15–1.40)
Chloride: 103 mmol/L (ref 98–111)
Creatinine, Ser: 0.8 mg/dL (ref 0.44–1.00)
Glucose, Bld: 89 mg/dL (ref 70–99)
HCT: 42 % (ref 36.0–46.0)
Hemoglobin: 14.3 g/dL (ref 12.0–15.0)
Potassium: 3.4 mmol/L — ABNORMAL LOW (ref 3.5–5.1)
Sodium: 141 mmol/L (ref 135–145)
TCO2: 25 mmol/L (ref 22–32)

## 2023-03-18 LAB — COMPREHENSIVE METABOLIC PANEL
ALT: 15 U/L (ref 0–44)
AST: 18 U/L (ref 15–41)
Albumin: 4.5 g/dL (ref 3.5–5.0)
Alkaline Phosphatase: 51 U/L (ref 38–126)
Anion gap: 10 (ref 5–15)
BUN: 10 mg/dL (ref 6–20)
CO2: 25 mmol/L (ref 22–32)
Calcium: 9.2 mg/dL (ref 8.9–10.3)
Chloride: 103 mmol/L (ref 98–111)
Creatinine, Ser: 0.73 mg/dL (ref 0.44–1.00)
GFR, Estimated: >60 mL/min (ref >60)
Glucose, Bld: 93 mg/dL (ref 70–99)
Potassium: 3.1 mmol/L — ABNORMAL LOW (ref 3.5–5.1)
Sodium: 138 mmol/L (ref 135–145)
Total Bilirubin: 0.8 mg/dL (ref 0.3–1.2)
Total Protein: 7.8 g/dL (ref 6.5–8.1)

## 2023-03-18 LAB — CK: Total CK: 181 U/L (ref 38–234)

## 2023-03-18 LAB — HCG, SERUM, QUALITATIVE: Preg, Serum: NEGATIVE

## 2023-03-18 MED ORDER — POTASSIUM CHLORIDE CRYS ER 20 MEQ PO TBCR
40.0000 meq | EXTENDED_RELEASE_TABLET | Freq: Once | ORAL | Status: AC
  Administered 2023-03-18: 40 meq via ORAL
  Filled 2023-03-18: qty 2

## 2023-03-18 MED ORDER — POTASSIUM CHLORIDE CRYS ER 20 MEQ PO TBCR: 20.0000 meq | EXTENDED_RELEASE_TABLET | Freq: Every day | ORAL | 0 refills | Status: DC

## 2023-03-18 MED ORDER — IBUPROFEN 800 MG PO TABS
ORAL_TABLET | ORAL | Status: AC
  Filled 2023-03-18: qty 1

## 2023-03-18 MED ORDER — IBUPROFEN 800 MG PO TABS
800.0000 mg | ORAL_TABLET | Freq: Once | ORAL | Status: AC
  Administered 2023-03-18: 800 mg via ORAL

## 2023-03-18 MED ORDER — GABAPENTIN 100 MG PO CAPS: 100.0000 mg | ORAL_CAPSULE | Freq: Three times a day (TID) | ORAL | 1 refills | Status: DC

## 2023-03-18 MED ORDER — GADOBUTROL 1 MMOL/ML IV SOLN
5.0000 mL | Freq: Once | INTRAVENOUS | Status: AC | PRN
Start: 2023-03-18 — End: 2023-03-18
  Administered 2023-03-18: 5 mL via INTRAVENOUS

## 2023-03-18 MED ORDER — POTASSIUM CHLORIDE CRYS ER 20 MEQ PO TBCR: 20.0000 meq | EXTENDED_RELEASE_TABLET | Freq: Every day | ORAL | 0 refills | Status: AC

## 2023-03-18 MED ORDER — OXYCODONE HCL 5 MG PO TABS
5.0000 mg | ORAL_TABLET | Freq: Once | ORAL | Status: AC
  Administered 2023-03-18: 5 mg via ORAL
  Filled 2023-03-18: qty 1

## 2023-03-18 MED ORDER — ONDANSETRON 4 MG PO TBDP
4.0000 mg | ORAL_TABLET | Freq: Once | ORAL | Status: AC
  Administered 2023-03-18: 4 mg via ORAL
  Filled 2023-03-18: qty 1

## 2023-03-18 NOTE — Discharge Instructions (Addendum)
Sent to ED

## 2023-03-18 NOTE — Discharge Instructions (Addendum)
Contact a health care provider if: ?You have new signs or symptoms of peripheral neuropathy. ?You are struggling emotionally from dealing with peripheral neuropathy. ?Your pain is not well controlled. ?Get help right away if: ?You have an injury or infection that is not healing normally. ?You develop new weakness in an arm or leg. ?You have fallen or do so frequently. ?

## 2023-03-18 NOTE — ED Triage Notes (Signed)
Patient reports she went bowling last night and while at an event with her mother today she noticed her left hand is swollen and her right arm is swollen and bilateral hands are numb and tingling.

## 2023-03-18 NOTE — ED Provider Notes (Signed)
[place] EMERGENCY DEPARTMENT AT [facility] Provider Note   CSN: [id number] Arrival date & time: 03/18/23  1418     History  Chief Complaint  Patient presents with   Hand Problem    Kathleen Spears is a 27 y.o. female who presents emergency department with a chief complaint of bilateral hand and pain numbness and tingling.  Patient reports that she had sudden onset of the symptoms starting this morning while at a church function with her mother.  She did go bowling last night but she only used her right hand temple and she denies neck pain headache or other neurologic deficits.  She denies any history of previous episodes of numbness or tingling.  She feels like her hands and forearms are swollen.  She states that it is very painful.  Review of EMR shows that the patient has been seen in the past for sensation of finger pain and swelling.  This appears to be just 1 finger.  It does not appear that she has had a neurological evaluation or imaging in the past.  Patient does not work with her hands.  She denies history of recent vaccination.  HPI     Home Medications Prior to Admission medications   Medication Sig Start Date End Date Taking? Authorizing Provider  albuterol (VENTOLIN HFA) 108 (90 Base) MCG/ACT inhaler Inhale 1-2 puffs into the lungs every 6 (six) hours as needed for wheezing or shortness of breath. 11/17/22   Margaretann Loveless, PA-C  cetirizine (ZYRTEC) 10 MG tablet Take 1 tablet (10 mg total) by mouth daily. 09/02/20   Storm Frisk, MD      Allergies    Hydrocodone    Review of Systems   Review of Systems  Physical Exam Updated Vital Signs BP 133/87 (BP Location: Right Arm)   Pulse 66   Temp 97.7 F (36.5 C)   Resp 16   Ht 5\' 2"  (1.575 m)   Wt 56.2 kg   LMP 03/12/2023   SpO2 100%   BMI 22.68 kg/m  Physical Exam Vitals and nursing note reviewed.  Constitutional:      General: She is not in acute distress.    Appearance: She is  well-developed. She is not diaphoretic.  HENT:     Head: Normocephalic and atraumatic.     Right Ear: External ear normal.     Left Ear: External ear normal.     Nose: Nose normal.     Mouth/Throat:     Mouth: Mucous membranes are moist.  Eyes:     General: No scleral icterus.    Conjunctiva/sclera: Conjunctivae normal.  Cardiovascular:     Rate and Rhythm: Normal rate and regular rhythm.     Heart sounds: Normal heart sounds. No murmur heard.    No friction rub. No gallop.  Pulmonary:     Effort: Pulmonary effort is normal. No respiratory distress.     Breath sounds: Normal breath sounds.  Abdominal:     General: Bowel sounds are normal. There is no distension.     Palpations: Abdomen is soft. There is no mass.     Tenderness: There is no abdominal tenderness. There is no guarding.  Musculoskeletal:     Cervical back: Normal range of motion and neck supple.     Comments: Bilateral hands and fingers are cool to the touch.  She has 2+ radial pulses bilaterally.  Grip strength is weak due to pain.  No obvious swelling or  deformity.  Negative Tinel or Phalen signs.  Skin:    General: Skin is warm and dry.  Neurological:     Mental Status: She is alert and oriented to person, place, and time.  Psychiatric:        Behavior: Behavior normal.     ED Results / Procedures / Treatments   Labs (all labs ordered are listed, but only abnormal results are displayed) Labs Reviewed  COMPREHENSIVE METABOLIC PANEL - Abnormal; Notable for the following components:      Result Value   Potassium 3.1 (*)    All other components within normal limits  I-STAT CHEM 8, ED - Abnormal; Notable for the following components:   Potassium 3.4 (*)    All other components within normal limits  CBC WITH DIFFERENTIAL/PLATELET  HCG, SERUM, QUALITATIVE  CK    EKG None  Radiology DG Hand Complete Left  Result Date: 03/18/2023 CLINICAL DATA:  Swelling pain third through fifth fingers EXAM: LEFT HAND -  COMPLETE 3+ VIEW COMPARISON:  None Available. FINDINGS: There is no evidence of fracture or dislocation. There is no evidence of arthropathy or other focal bone abnormality. Soft tissues are unremarkable. IMPRESSION: Negative. Electronically Signed   By: Judie Petit.  Shick M.D.   On: 03/18/2023 14:06    Procedures Procedures    Medications Ordered in ED Medications  oxyCODONE (Oxy IR/ROXICODONE) immediate release tablet 5 mg (has no administration in time range)  ondansetron (ZOFRAN-ODT) disintegrating tablet 4 mg (has no administration in time range)    ED Course/ Medical Decision Making/ A&P Clinical Course as of 03/18/23 1817  Sat Mar 18, 2023  1809 Potassium(!): 3.1 [AH]  1815 CK [AH]  1815 hCG, serum, qualitative [AH]  1815 CBC with Differential [AH]    Clinical Course User Index [AH] Arthor Captain, PA-C                                 Medical Decision Making 27 year old female who presents the emergency department with a chief complaint of paresthesia and numbness of the bilateral hands progressing rapidly over the day today. The differential diagnosis of paresthesias includes but is not limited ZO:XWRUEAVWUJ, diabetic neuropathy, es mellitus, entrapment neuropathy, eg, carpal tunnel syndrome, tarsal tunnel syndrome, meralgia paresthetica) hypocalcemia, multiple sclerosis, spinal cord lesion, nerve root compression, herpes zoster, transient ischemic attack, Guillain-Barr syndrome, trigeminal neuralgia, migraine, partial seizure, reflex sympathetic dystrophy, thoracic outlet syndrome, brachial plexus neuropathy.  Initial impression: Patient does not appear to have any evidence of neck injury or carpal tunnel syndrome.  This may be rheumatologic or psychiatric will evaluate for aggressive MS as well.  Labs are pending.  Will rule out electrolyte disturbance.  Case discussed with Dr. Iver Nestle who agrees with plan for MRI with without of the head and neck.    Patient here with neuropathic  pain bilateral upper extremities.  I ordered visualized and interpreted images of an MRI with without of the neck and head.  No evidence of impingement of the spinal cord or peripheral nerves, no evidence of demyelinating lesions or other significant abnormality in the head and neck.  Reviewed the patient's labs which shows mild hypokalemia repleted here orally and will give several days of 20 mEq tablets.  I do not think this is the cause of her pain and paresthesia in a blood distribution.   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  Will refer to neurology for further evaluation with nerve testing.  Will give her low-dose gabapentin for pain relief.  Patient was otherwise appropriate for discharge at this time.   Amount and/or Complexity of Data Reviewed Labs: ordered. Decision-making details documented in ED Course. Radiology: ordered and independent interpretation performed.  Risk Prescription drug management.           Final Clinical Impression(s) / ED Diagnoses Final diagnoses:  None    Rx / DC Orders ED Discharge Orders     None         Arthor Captain, PA-C 03/18/23 1835    Charlynne Pander, MD 03/18/23 [phone]

## 2023-03-18 NOTE — ED Triage Notes (Signed)
Patient reports that she noticed that she had swelling and tingling to the left middle, ring, and pinky fingers. When asked about injury patient stated she didn't know.

## 2023-03-18 NOTE — ED Provider Notes (Signed)
[facility]    CSN: [id number] Arrival date & time: 03/18/23  1139     History   Chief Complaint Chief Complaint  Patient presents with   Hand Pain    HPI Kathleen Spears is a 27 y.o. female.  This morning noticed tingling and swelling in the left hand. In fingers 3 4 and 5. No injury or trauma. She has trouble bending the fingers. No pain in the wrist, forearm, elbow, or shoulder No medications yet  History of left hand ring finger pain and swelling that she has seen both rheumatology and orthopedics for this year. Per notes, problem has been ongoing for 10 years  Past Medical History:  Diagnosis Date   Anxiety attack    Asthma    Herpes    Migraines    Sickle-cell trait [facility])     Patient Active Problem List   Diagnosis Date Noted   Finger pain, left 12/19/2022   Sore throat 09/02/2020   Sickle cell trait (HCC) 07/31/2019   GERD (gastroesophageal reflux disease) 07/31/2019   Anxiety 06/19/2019   Asthma 07/08/2015   Migraine without aura and without status migrainosus, not intractable 05/02/2014    Past Surgical History:  Procedure Laterality Date   TONSILLECTOMY     WISDOM TOOTH EXTRACTION      OB History     Gravida  1   Para  1   Term  1   Preterm      AB      Living  1      SAB      IAB      Ectopic      Multiple  0   Live Births  1            Home Medications    Prior to Admission medications   Medication Sig Start Date End Date Taking? Authorizing Provider  albuterol (VENTOLIN HFA) 108 (90 Base) MCG/ACT inhaler Inhale 1-2 puffs into the lungs every 6 (six) hours as needed for wheezing or shortness of breath. 11/17/22   Margaretann Loveless, PA-C  cetirizine (ZYRTEC) 10 MG tablet Take 1 tablet (10 mg total) by mouth daily. 09/02/20   Storm Frisk, MD    Family History Family History  Problem Relation Age of Onset   Bone cancer Mother    Heart murmur Brother     Social History Social History   Tobacco  Use   Smoking status: Never    Passive exposure: Past   Smokeless tobacco: Never  Vaping Use   Vaping status: Never Used  Substance Use Topics   Alcohol use: No    Alcohol/week: 0.0 standard drinks of alcohol    Comment: social   Drug use: No     Allergies   Hydrocodone   Review of Systems Review of Systems Per HPI  Physical Exam Triage Vital Signs ED Triage Vitals  Encounter Vitals Group     BP 03/18/23 1252 112/73     Systolic BP Percentile --      Diastolic BP Percentile --      Pulse Rate 03/18/23 1252 68     Resp 03/18/23 1252 14     Temp 03/18/23 1252 98.4 F (36.9 C)     Temp Source 03/18/23 1252 Oral     SpO2 03/18/23 1252 98 %     Weight --      Height --      Head Circumference --  Peak Flow --      Pain Score 03/18/23 1251 10     Pain Loc --      Pain Education --      Exclude from Growth Chart --    No data found.  Updated Vital Signs BP 112/73 (BP Location: Right Arm)   Pulse 68   Temp 98.4 F (36.9 C) (Oral)   Resp 14   LMP 03/12/2023   SpO2 98%    Physical Exam Vitals and nursing note reviewed.  Constitutional:      General: She is not in acute distress. Cardiovascular:     Rate and Rhythm: Normal rate and regular rhythm.     Pulses: Normal pulses.  Pulmonary:     Effort: Pulmonary effort is normal.  Musculoskeletal:     Cervical back: Normal range of motion.     Comments: There is no obvious swelling or deformity. Minimal active ROM of the left hand 3-5 fingers. Full passive ROM at all joints of left hand fingers. Normal wrist, elbow, and shoulder. Decreased sensation distally but can feel warmth. Cap refill is < 2 seconds. Strong radial pulse.   Skin:    Capillary Refill: Capillary refill takes less than 2 seconds.  Neurological:     Mental Status: She is alert and oriented to person, place, and time.     UC Treatments / Results  Labs (all labs ordered are listed, but only abnormal results are displayed) Labs Reviewed  - No data to display  EKG   Radiology DG Hand Complete Left  Result Date: 03/18/2023 CLINICAL DATA:  Swelling pain third through fifth fingers EXAM: LEFT HAND - COMPLETE 3+ VIEW COMPARISON:  None Available. FINDINGS: There is no evidence of fracture or dislocation. There is no evidence of arthropathy or other focal bone abnormality. Soft tissues are unremarkable. IMPRESSION: Negative. Electronically Signed   By: Judie Petit.  Shick M.D.   On: 03/18/2023 14:06    Procedures Procedures (including critical care time)  Medications Ordered in UC Medications  ibuprofen (ADVIL) tablet 800 mg (800 mg Oral Given 03/18/23 1338)    Initial Impression / Assessment and Plan / UC Course  I have reviewed the triage vital signs and the nursing notes.  Pertinent labs & imaging results that were available during my care of the patient were reviewed by me and considered in my medical decision making (see chart for details).  Ibuprofen dose given for pain Left hand xray is negative Prior to discharging patient with hand specialist follow up, she began having symptoms on the right arm as well. Reports she is not able to use either of her hands. She has pain, reported swelling, and tingling. Sent to ED for higher level of care  Final Clinical Impressions(s) / UC Diagnoses   Final diagnoses:  Left hand pain  Neuropathy  Loss of sensation     Discharge Instructions      Sent to ED     ED Prescriptions   None    PDMP not reviewed this encounter.   Marlow Baars, [place] 03/18/23 1422

## 2023-03-21 ENCOUNTER — Encounter: Payer: Self-pay | Admitting: Neurology

## 2023-03-27 ENCOUNTER — Other Ambulatory Visit: Payer: Self-pay

## 2023-03-27 ENCOUNTER — Emergency Department (HOSPITAL_COMMUNITY)
Admission: EM | Admit: 2023-03-27 | Discharge: 2023-03-27 | Disposition: A | Discharge status: Home / Self Care | Payer: 59 | Attending: Emergency Medicine | Admitting: Emergency Medicine

## 2023-03-27 ENCOUNTER — Encounter (HOSPITAL_COMMUNITY): Payer: Self-pay

## 2023-03-27 DIAGNOSIS — R202 Paresthesia of skin: Secondary | ICD-10-CM | POA: Insufficient documentation

## 2023-03-27 DIAGNOSIS — R200 Anesthesia of skin: Secondary | ICD-10-CM | POA: Diagnosis not present

## 2023-03-27 NOTE — Discharge Instructions (Signed)
Keep your scheduled appointments with primary care and with neurology.   Gabapentin: Take 300 mg (3 capsules) twice daily for the next week If no relief in symptoms after one week, increase your dose to 300 mg 3 times a day until seen by neurology.

## 2023-03-27 NOTE — ED Provider Notes (Signed)
[place] EMERGENCY DEPARTMENT AT [facility] Provider Note   CSN: [id number] Arrival date & time: 03/27/23  1341     History  Chief Complaint  Patient presents with   Finger swelling    Kathleen Spears is a 27 y.o. female.  Patient with recent visit for swelling in bilateral hands and tingling/numbness returns with no improvement in symptoms with medication prescribed. Seen 11/2 and referred to primary care and neurology. She has made appointments but PCP appt is not available until 11/27 and neurology not available til 12/7. She was given gabapentin 100 mg TID but states it is not improving symptoms. No new symptoms to report.   The history is provided by the patient. No language interpreter was used.       Home Medications Prior to Admission medications   Medication Sig Start Date End Date Taking? Authorizing Provider  albuterol (VENTOLIN HFA) 108 (90 Base) MCG/ACT inhaler Inhale 1-2 puffs into the lungs every 6 (six) hours as needed for wheezing or shortness of breath. 11/17/22   Margaretann Loveless, PA-C  cetirizine (ZYRTEC) 10 MG tablet Take 1 tablet (10 mg total) by mouth daily. 09/02/20   Storm Frisk, MD  gabapentin (NEURONTIN) 100 MG capsule Take 1 capsule (100 mg total) by mouth 3 (three) times daily. 03/18/23   Harris, Cammy Copa, PA-C  potassium chloride SA (KLOR-CON M) 20 MEQ tablet Take 1 tablet (20 mEq total) by mouth daily. 03/18/23   Arthor Captain, PA-C      Allergies    Hydrocodone    Review of Systems   Review of Systems  Physical Exam Updated Vital Signs BP 129/76   Pulse 69   Temp 98 F (36.7 C) (Oral)   Resp 16   Ht 5\' 2"  (1.575 m)   Wt 56.2 kg   LMP 03/12/2023   SpO2 100%   BMI 22.66 kg/m  Physical Exam Vitals and nursing note reviewed.  Constitutional:      Appearance: Normal appearance.  Cardiovascular:     Rate and Rhythm: Normal rate.  Pulmonary:     Effort: Pulmonary effort is normal.  Musculoskeletal:         General: Normal range of motion.     Comments: No significant swelling to hands. No discoloration.   Neurological:     Mental Status: She is alert.     ED Results / Procedures / Treatments   Labs (all labs ordered are listed, but only abnormal results are displayed) Labs Reviewed - No data to display  EKG None  Radiology No results found.  Procedures Procedures    Medications Ordered in ED Medications - No data to display  ED Course/ Medical Decision Making/ A&P Clinical Course as of 03/27/23 2154  [facility] Mar 27, 2023  1450 Patient seen in ED today for persistent symptoms of hand swelling and numbness. Seen 11/2 in ED - chart reviewed. At that time she had a negative MR brain and cervical spine. Labs obtained showed a normal CBC, CK, Cmet (except for mild hypokalemia), negative pregnancy. She was started on gabapentin 100 mg TID. Discussed that no further testing in the ED would make the work up more complete. She has scheduled appointments for PCP and neurology. She is encouraged to attend those appointments. Discussed gabapentin dosing with pharmacy. Her dose was adjusted from current to 300 mg BID. She was told she could increase this if needed after one week to 300 mg TID until seen by neurology. The  patient acknowledged understanding and is comfortable with discharge home.  [SU]    Clinical Course User Index [SU] Elpidio Anis, PA-C                                 Medical Decision Making          Final Clinical Impression(s) / ED Diagnoses Final diagnoses:  Paresthesias    Rx / DC Orders ED Discharge Orders     None         Danne Harbor 03/27/23 2154    Ernie Avena, MD 03/28/23 [phone]

## 2023-03-27 NOTE — ED Triage Notes (Signed)
Patient came in for evaluation of swollen fingers. Patient reports being seen for the same recently and was given gabapentin for the pain. Reports her fingers are still swollen. Pt states she is suppose to follow up with Neurology but not until December.

## 2023-04-06 DIAGNOSIS — R600 Localized edema: Secondary | ICD-10-CM | POA: Diagnosis not present

## 2023-04-06 DIAGNOSIS — E876 Hypokalemia: Secondary | ICD-10-CM | POA: Diagnosis not present

## 2023-04-06 DIAGNOSIS — R202 Paresthesia of skin: Secondary | ICD-10-CM | POA: Diagnosis not present

## 2023-04-19 NOTE — Progress Notes (Signed)
Initial neurology clinic note  Reason for Evaluation: Consultation requested by Arthor Captain, PA-C for an opinion regarding hand pain. My final recommendations will be communicated back to the requesting physician by way of shared medical record or letter to requesting physician via [place] mail.  HPI: This is [name], a 27 y.o. right-handed female with a medical history of migraines, sickle-cell trait, anxiety, asthma who presents to neurology clinic with the chief complaint of hand pain. The patient is alone today.  Patient woke up on morning of 03/18/2023 and noticed swelling in her left hand fingers (digits 3-5). Her hand was also cold. She went to Urgent Care and got xrays (normal). While waiting, she had swelling in her right arm and fingers. She had numbness as well. She then went to ED who did MRI brain and cervical spine which were normal. Since onset, she continues have swelling in arms. She has numbness and tingling in right > left hand as well. She shakes out her hands for relief. It is worse at night. The middle and index fingers seem to be the worse. She denies significant neck pain. Patient was given gabapentin 100 mg TID which did not help. This was increased to 200 mg BID which did not help. She then took 300 mg TID for 2 days, but this did not help. She also recently started amitriptyline 10 mg daily for her pain but has not seemed to change symptoms thus far.  Of note, patient was seen by Dr. Dimple Casey in rheumatology on 12/19/22 for left finger pain. There was no rheumatologic cause of symptoms found at that time.  Patient works in a preschool as a Geophysicist/field seismologist.   She does not report any constitutional symptoms like fever, night sweats, anorexia or unintentional weight loss.  EtOH use: Very rare  Restrictive diet? No Family history of neuropathy/myopathy/neurologic disease? No   MEDICATIONS:  Outpatient Encounter Medications as of 04/27/2023  Medication Sig    albuterol (VENTOLIN HFA) 108 (90 Base) MCG/ACT inhaler Inhale 1-2 puffs into the lungs every 6 (six) hours as needed for wheezing or shortness of breath.   amitriptyline (ELAVIL) 10 MG tablet Take 10 mg by mouth at bedtime.   cetirizine (ZYRTEC) 10 MG tablet Take 1 tablet (10 mg total) by mouth daily.   gabapentin (NEURONTIN) 100 MG capsule Take 1 capsule (100 mg total) by mouth 3 (three) times daily. (Patient not taking: Reported on 04/27/2023)   potassium chloride SA (KLOR-CON M) 20 MEQ tablet Take 1 tablet (20 mEq total) by mouth daily. (Patient not taking: Reported on 04/27/2023)   No facility-administered encounter medications on file as of 04/27/2023.    PAST MEDICAL HISTORY: Past Medical History:  Diagnosis Date   Anxiety attack    Asthma    Herpes    Migraines    Sickle-cell trait (HCC)     PAST SURGICAL HISTORY: Past Surgical History:  Procedure Laterality Date   TONSILLECTOMY     WISDOM TOOTH EXTRACTION      ALLERGIES: Allergies  Allergen Reactions   Hydrocodone Nausea And Vomiting    FAMILY HISTORY: Family History  Problem Relation Age of Onset   Bone cancer Mother    Heart murmur Brother     SOCIAL HISTORY: Social History   Tobacco Use   Smoking status: Never    Passive exposure: Past   Smokeless tobacco: Never  Vaping Use   Vaping status: Never Used  Substance Use Topics   Alcohol use: No  Alcohol/week: 0.0 standard drinks of alcohol    Comment: social   Drug use: No   Social History   Social History Narrative   Are you right handed or left handed? Right   Are you currently employed ? 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 mother   What type of home do you live in: 1 story or 2 story? one    Caffiene occas     OBJECTIVE: PHYSICAL EXAM: BP 110/76   Pulse 78   Ht 5\' 2"  (1.575 m)   Wt 127 lb (57.6 kg)   SpO2 98%   BMI 23.23 kg/m   General: General appearance: Awake  and alert. No distress. Cooperative with exam.  Skin: No obvious rash or jaundice. HEENT: Atraumatic. Anicteric. Lungs: Non-labored breathing on room air  Extremities: No obvious deformity.  Musculoskeletal: No obvious joint swelling. Psych: Affect appropriate.  Neurological: Mental Status: Alert. Speech fluent. No pseudobulbar affect Cranial Nerves: CNII: No RAPD. Visual fields grossly intact. CNIII, IV, VI: PERRL. No nystagmus. EOMI. CN V: Facial sensation intact bilaterally to fine touch. CN VII: Facial muscles symmetric and strong. No ptosis at rest. CN VIII: Hearing grossly intact bilaterally. CN IX: No hypophonia. CN X: Palate elevates symmetrically. CN XI: Full strength shoulder shrug bilaterally. CN XII: Tongue protrusion full and midline. No atrophy or fasciculations. No significant dysarthria Motor: Tone is normal. No obvious atrophy.  Individual muscle group testing (MRC grade out of 5):  Movement     Neck flexion 5    Neck extension 5     Right Left   Shoulder abduction 5 5   Shoulder adduction 5 5   Shoulder ext rotation 5 5   Shoulder int rotation 5 5   Elbow flexion 5 5   Elbow extension 5 5   Wrist extension 5 5   Wrist flexion 5 5   Finger abduction - FDI 5 5   Finger abduction - ADM 5 5   Finger extension 5 5   Finger distal flexion - 2/3 5 5    Finger distal flexion - 4/5 5 5    Thumb flexion - FPL 5 5   Thumb abduction - APB 5- 5-    Hip flexion 5 5   Hip extension 5 5   Hip adduction 5 5   Hip abduction 5 5   Knee extension 5 5   Knee flexion 5 5   Dorsiflexion 5 5   Plantarflexion 5 5     Reflexes:  Right Left   Bicep 2+ 2+   Tricep 2+ 2+   BrRad 2+ 2+   Knee 2+ 2+   Ankle 2+ 2+    Pathological Reflexes: Babinski: flexor response bilaterally Hoffman: absent bilaterally Troemner: absent bilaterally Sensation: Positive Phalen; positive Tinel's test at bilateral carpal tunnel Pinprick: Intact in all extremities Vibration: intact  in all extremities Coordination: Intact finger-to- nose-finger bilaterally. Romberg negative. Gait: Able to rise from chair with arms crossed unassisted. Normal, narrow-based gait. Able to tandem walk. Able to walk on toes and heels.  Lab and Test Review: Internal labs: 03/18/23: CK 181 CBC w/ diff unremarkable CMP significant for K 3.1  12/19/22: CCP ab neg RF neg  Imaging: Left hand xray (03/18/23): FINDINGS: There is no evidence of fracture or dislocation. There is no evidence of arthropathy or other focal bone abnormality. Soft tissues are unremarkable.   IMPRESSION: Negative.  MRI brain w/wo contrast (03/18/23): FINDINGS: Brain: There is no acute intracranial hemorrhage, extra-axial fluid collection, or acute infarct.   Parenchymal volume is normal. The ventricles are normal in size. Parenchymal signal is normal, with no acute finding or evidence of demyelinating disease.   The pituitary and suprasellar region are normal. There is no mass lesion or abnormal enhancement. There is no mass effect or midline shift.   Vascular: Normal flow voids.   Skull and upper cervical spine: Normal marrow signal.   Sinuses/Orbits: The paranasal sinuses are clear. The globes and orbits are unremarkable.   Other: The mastoid air cells and middle ear cavities are clear.   IMPRESSION: Normal brain MRI.  MRI cervical spine w/wo contrast (03/18/23): FINDINGS: Alignment: Normal.   Vertebrae: Vertebral body heights are preserved. Background marrow signal is normal. There is no suspicious marrow signal abnormality or marrow edema.   Cord: Normal in signal and morphology without abnormal enhancement.   Posterior Fossa, vertebral arteries, paraspinal tissues: The posterior fossa is assessed on the separately dictated brain MRI. The vertebral artery flow voids are normal. The paraspinal soft tissues are unremarkable.   Disc levels:   There is a minimal disc protrusion at C5-C6.  There is a small disc protrusion and minimal degenerative endplate change at C6-C7. There is no other significant degenerative change. There is no significant spinal canal or neural foraminal stenosis at any level.   IMPRESSION: No cord signal abnormality or evidence of nerve root impingement to explain the patient's symptoms.   ASSESSMENT: Kathleen Spears is a 27 y.o. female who presents for evaluation of bilateral hand pain and swelling. She has a relevant medical history of migraines, sickle-cell trait, anxiety, asthma. Her neurological examination is pertinent for possible bilateral APB weakness, positive Phalen, and positive Tinel's test at carpal tunnel bilaterally. Available diagnostic data is significant for MRI brain and cervical spine that were essentially normal. The pain in patient's hands may be most consistent with carpal tunnel syndrome. I'm not sure this would explain her swelling, but swelling could put more pressure on the nerve. I will get an EMG to further clarify. She will wear braces to see if this helps and restart gabapentin 300 mg TID for symptom relief.  PLAN: -EMG: bilateral upper extremities (CTS protocol) -Given information on bilateral cock up wrist splints for possible carpal tunnel -Gabapentin 300 mg TID  -Return to clinic to be determined  The impression above as well as the plan as outlined below were extensively discussed with the patient who voiced understanding. All questions were answered to their satisfaction.  When available, results of the above investigations and possible further recommendations will be communicated to the patient via telephone/MyChart. Patient to call office if not contacted after expected testing turnaround time.   Total time spent reviewing records, interview, history/exam, documentation, and coordination of care on day of encounter:  50 min   Thank you for allowing me to participate in patient's care.  If I can answer any  additional questions, I would be pleased to do so.  Jacquelyne Balint, MD   CC: Pcp, No No address on file  CC: Referring provider: Arthor Captain, PA-C No address on file

## 2023-04-27 ENCOUNTER — Ambulatory Visit (INDEPENDENT_AMBULATORY_CARE_PROVIDER_SITE_OTHER): Payer: 59 | Admitting: Neurology

## 2023-04-27 ENCOUNTER — Encounter: Payer: Self-pay | Admitting: Neurology

## 2023-04-27 VITALS — BP 110/76 | HR 78 | Ht 62.0 in | Wt 127.0 lb

## 2023-04-27 DIAGNOSIS — G5603 Carpal tunnel syndrome, bilateral upper limbs: Secondary | ICD-10-CM | POA: Diagnosis not present

## 2023-04-27 DIAGNOSIS — M7989 Other specified soft tissue disorders: Secondary | ICD-10-CM | POA: Diagnosis not present

## 2023-04-27 DIAGNOSIS — M79641 Pain in right hand: Secondary | ICD-10-CM | POA: Diagnosis not present

## 2023-04-27 DIAGNOSIS — M79642 Pain in left hand: Secondary | ICD-10-CM

## 2023-04-27 MED ORDER — GABAPENTIN 300 MG PO CAPS: 300.0000 mg | ORAL_CAPSULE | Freq: Three times a day (TID) | ORAL | 3 refills | Status: DC

## 2023-04-27 NOTE — Patient Instructions (Addendum)
I saw you today for hand pain. This may be a pinched nerve in your wrist called carpal tunnel syndrome.  I would like to investigate further with a muscle and nerve test called an EMG (see more information below). We will discuss next steps after the EMG.  In the meantime, I want you to wear the carpal tunnel brace on both wrists at night (see below). I am also prescribing gabapentin 300 mg three times daily for the pain.    Cock up wrist splint for carpal tunnel symptoms can be bought in local drug stores or online. This should especially be worn at night while sleeping.      The physicians and staff at [facility] are committed to providing excellent care. You may receive a survey requesting feedback about your experience at our office. We strive to receive "very good" responses to the survey questions. If you feel that your experience would prevent you from giving the office a "very good " response, please contact our office to try to remedy the situation. We may be reached at [phone]. Thank you for taking the time out of your busy day to complete the survey.  Jacquelyne Balint, MD [facility]  ELECTROMYOGRAM AND NERVE CONDUCTION STUDIES (EMG/NCS) INSTRUCTIONS  How to Prepare The neurologist conducting the EMG will need to know if you have certain medical conditions. Tell the neurologist and other EMG lab personnel if you: Have a pacemaker or any other electrical medical device Take blood-thinning medications Have hemophilia, a blood-clotting disorder that causes prolonged bleeding Bathing Take a shower or bath shortly before your exam in order to remove oils from your skin. Don't apply lotions or creams before the exam.  What to Expect You'll likely be asked to change into a hospital gown for the procedure and lie down on an examination table. The following explanations can help you understand what will happen during the exam.  Electrodes. The neurologist or a technician  places surface electrodes at various locations on your skin depending on where you're experiencing symptoms. Or the neurologist may insert needle electrodes at different sites depending on your symptoms.  Sensations. The electrodes will at times transmit a tiny electrical current that you may feel as a twinge or spasm. The needle electrode may cause discomfort or pain that usually ends shortly after the needle is removed. If you are concerned about discomfort or pain, you may want to talk to the neurologist about taking a short break during the exam.  Instructions. During the needle EMG, the neurologist will assess whether there is any spontaneous electrical activity when the muscle is at rest - activity that isn't present in healthy muscle tissue - and the degree of activity when you slightly contract the muscle.  He or she will give you instructions on resting and contracting a muscle at appropriate times. Depending on what muscles and nerves the neurologist is examining, he or she may ask you to change positions during the exam.  After your EMG You may experience some temporary, minor bruising where the needle electrode was inserted into your muscle. This bruising should fade within several days. If it persists, contact your primary care doctor.

## 2023-05-01 ENCOUNTER — Ambulatory Visit (INDEPENDENT_AMBULATORY_CARE_PROVIDER_SITE_OTHER): Payer: 59 | Admitting: Neurology

## 2023-05-01 ENCOUNTER — Telehealth: Payer: Self-pay | Admitting: Neurology

## 2023-05-01 DIAGNOSIS — G5603 Carpal tunnel syndrome, bilateral upper limbs: Secondary | ICD-10-CM

## 2023-05-01 DIAGNOSIS — M79641 Pain in right hand: Secondary | ICD-10-CM | POA: Diagnosis not present

## 2023-05-01 DIAGNOSIS — M79642 Pain in left hand: Secondary | ICD-10-CM | POA: Diagnosis not present

## 2023-05-01 DIAGNOSIS — M7989 Other specified soft tissue disorders: Secondary | ICD-10-CM

## 2023-05-01 NOTE — Procedures (Signed)
[facility]  [address], [address]  [place], [place] [address] Tel: [phone] Fax: [phone] Test Date:  05/01/2023  Patient: Kathleen Spears DOB: 1996-04-05 Physician: Jacquelyne Balint, MD  Sex: Female Height: 5\' 2"  Ref Phys: Jacquelyne Balint, MD  ID#: [id number]   Technician:    History: This is a 27 year old female with bilateral hand pain.  NCV & EMG Findings: Extensive electrodiagnostic evaluation of bilateral upper limbs shows: Right median sensory response shows a prolonged distal peak latency (3.5 ms). Left median-ulnar palmar sensory response shows prolonged distal peak latency (Median Palm-Wrist, 2.7 ms) and abnormal peak latency difference ((Median Palm-Wrist)-(Ulnar Palm-Wrist), 0.77 ms). Left median, bilateral ulnar, and bilateral radial sensory responses are within normal limits. Bilateral median (APB) and ulnar (ADM) motor responses are within normal limits. There is no evidence of active or chronic motor axon loss changes affecting any of the tested muscles on needle examination. Motor unit configuration and recruitment pattern is within normal limits.  Impression: This is an abnormal study. The findings are most consistent with the following: Evidence of bilateral median mononeuropathy at or distal to the wrist, consistent with carpal tunnel syndrome, mild in degree electrically on the right and very mild in degree electrically on the left. No electrodiagnostic evidence of a right or left cervical (C5-C8) motor radiculopathy. Screening studies for right or left ulnar or radial mononeuropathies are normal.    ___________________________ Jacquelyne Balint, MD    Nerve Conduction Studies Motor Nerve Results    Latency Amplitude F-Lat Segment Distance CV Comment  Site (ms) Norm (mV) Norm (ms)  (cm) (m/s) Norm   Left Median (APB) Motor  Wrist 2.8  < 3.9 6.3  > 6.0        Elbow 7.2 - 6.3 -  Elbow-Wrist 26 59  > 51   Right Median (APB) Motor  Wrist 2.5  <  3.9 11.5  > 6.0        Elbow 7.5 - 10.4 -  Elbow-Wrist 27 54  > 51   Left Ulnar (ADM) Motor  Wrist 2.2  < 3.0 11.9  > 8.0        Bel elbow 5.6 - 10.9 -  Bel elbow-Wrist 19 56  > 51   Ab elbow 7.6 - 10.6 -  Ab elbow-Bel elbow 10 50 -   Right Ulnar (ADM) Motor  Wrist 1.68  < 3.0 15.4  > 8.0        Bel elbow 4.9 - 13.8 -  Bel elbow-Wrist 20 63  > 51   Ab elbow 6.6 - 13.4 -  Ab elbow-Bel elbow 10 59 -    Sensory Sites    Neg Peak Lat Amplitude (O-P) Segment Distance Velocity Comment  Site (ms) Norm (V) Norm  (cm) (ms)   Left Median Sensory  Wrist-Dig II 3.2  < 3.3 77  > 20 Wrist-Dig II 13    Right Median Sensory  Wrist-Dig II *3.5  < 3.3 69  > 20 Wrist-Dig II 13    Left Median-Ulnar Palmar Sensory       Median  Palm-Wrist *2.7  < 2.2 34  > 10 Palm-Wrist 8         Ulnar  Palm-Wrist 1.93  < 2.2 26  > 5 Palm-Wrist 8    Left Radial Sensory  Forearm-Wrist 2.2  < 2.7 46  > 18 Forearm-Wrist 10    Right Radial Sensory  Forearm-Wrist 2.1  < 2.7 32  > 18 Forearm-Wrist  10    Left Ulnar Sensory  Wrist-Dig V 2.7  < 3.3 69  > 18 Wrist-Dig V 11    Right Ulnar Sensory  Wrist-Dig V 2.6  < 3.3 72  > 18 Wrist-Dig V 11     Inter-Nerve Comparisons   Nerve 1 Value 1 Nerve 2 Value 2 Parameter Result Normal  Sensory Sites  L Median Palm-Wrist 2.7 ms L Ulnar Palm-Wrist 1.93 ms Peak Lat Diff *0.77 ms <0.40   Electromyography   Side Muscle Ins.Act Fibs Fasc Recrt Amp Dur Poly Activation Comment  Right FDI Nml Nml Nml Nml Nml Nml Nml Nml N/A  Right EIP Nml Nml Nml Nml Nml Nml Nml Nml N/A  Right Pronator teres Nml Nml Nml Nml Nml Nml Nml Nml N/A  Right Biceps Nml Nml Nml Nml Nml Nml Nml Nml N/A  Right Triceps Nml Nml Nml Nml Nml Nml Nml Nml N/A  Right Deltoid Nml Nml Nml Nml Nml Nml Nml Nml N/A  Left FDI Nml Nml Nml Nml Nml Nml Nml Nml N/A  Left EIP Nml Nml Nml Nml Nml Nml Nml Nml N/A  Left FPL Nml Nml Nml Nml Nml Nml Nml Nml N/A  Left Pronator teres Nml Nml Nml Nml Nml Nml Nml Nml N/A  Left  Biceps Nml Nml Nml Nml Nml Nml Nml Nml N/A  Left Triceps Nml Nml Nml Nml Nml Nml Nml Nml N/A  Left Deltoid Nml Nml Nml Nml Nml Nml Nml Nml N/A      Waveforms:  Motor           Sensory

## 2023-05-01 NOTE — Telephone Encounter (Signed)
Discussed the results of patient's EMG after the procedure today. It showed bilateral carpal tunnel syndrome (right > left). I recommended wrist splints as discussed at recent clinic visit. She is taking gabapentin for pain control as well. Patient will let me know if symptoms are worsening or not improving and then the plan would be to refer to hand surgeon for consideration of CTS release.  All questions were answered.  Jacquelyne Balint, MD [facility]

## 2023-05-16 NOTE — Progress Notes (Signed)
 NEUROLOGY FOLLOW UP OFFICE NOTE  Kathleen Spears [id number]  Subjective:  Kathleen Spears is a 27 y.o. year old right-handed female with a medical history of migraines, sickle-cell trait, anxiety, asthma who we last saw on 04/27/23 for hand pain.  To briefly review: Patient woke up on morning of 03/18/2023 and noticed swelling in her left hand fingers (digits 3-5). Her hand was also cold. She went to Urgent Care and got xrays (normal). While waiting, she had swelling in her right arm and fingers. She had numbness as well. She then went to ED who did MRI brain and cervical spine which were normal. Since onset, she continues have swelling in arms. She has numbness and tingling in right > left hand as well. She shakes out her hands for relief. It is worse at night. The middle and index fingers seem to be the worse. She denies significant neck pain. Patient was given gabapentin  100 mg TID which did not help. This was increased to 200 mg BID which did not help. She then took 300 mg TID for 2 days, but this did not help. She also recently started amitriptyline 10 mg daily for her pain but has not seemed to change symptoms thus far.   Of note, patient was seen by Dr. Jeannetta in rheumatology on 12/19/22 for left finger pain. There was no rheumatologic cause of symptoms found at that time.   Patient works in a preschool as a geophysicist/field seismologist.    She does not report any constitutional symptoms like fever, night sweats, anorexia or unintentional weight loss.   EtOH use: Very rare  Restrictive diet? No Family history of neuropathy/myopathy/neurologic disease? No  Most recent Assessment and Plan (04/27/23): Kathleen Spears is a 27 y.o. female who presents for evaluation of bilateral hand pain and swelling. She has a relevant medical history of migraines, sickle-cell trait, anxiety, asthma. Her neurological examination is pertinent for possible bilateral APB weakness, positive Phalen, and positive Tinel's  test at carpal tunnel bilaterally. Available diagnostic data is significant for MRI brain and cervical spine that were essentially normal. The pain in patient's hands may be most consistent with carpal tunnel syndrome. I'm not sure this would explain her swelling, but swelling could put more pressure on the nerve. I will get an EMG to further clarify. She will wear braces to see if this helps and restart gabapentin  300 mg TID for symptom relief.   PLAN: -EMG: bilateral upper extremities (CTS protocol) -Given information on bilateral cock up wrist splints for possible carpal tunnel -Gabapentin  300 mg TID  Since their last visit: EMG confirmed bilateral CTS, mild on right, very mild on left (electrically). I recommended wrist splints for CTS. Patient continues to have pain. Making a fist is very painful. The pain is going into wrist and forearm more now as well. The gabapentin  is not doing much. It makes her sleepy, but not too bad. She tried wearing the wrist splints but did not get much relief.   She is interested in talking to a hand surgeon as symptoms on the right have gotten much worse. She does not have much symptoms on the left.   MEDICATIONS:  Outpatient Encounter Medications as of 05/25/2023  Medication Sig   albuterol  (VENTOLIN  HFA) 108 (90 Base) MCG/ACT inhaler Inhale 1-2 puffs into the lungs every 6 (six) hours as needed for wheezing or shortness of breath.   amitriptyline (ELAVIL) 10 MG tablet Take 10 mg by mouth at bedtime.  cetirizine  (ZYRTEC ) 10 MG tablet Take 1 tablet (10 mg total) by mouth daily.   gabapentin  (NEURONTIN ) 300 MG capsule Take 1 capsule (300 mg total) by mouth 3 (three) times daily.   potassium chloride  SA (KLOR-CON  M) 20 MEQ tablet Take 1 tablet (20 mEq total) by mouth daily. (Patient not taking: Reported on 05/25/2023)   No facility-administered encounter medications on file as of 05/25/2023.    PAST MEDICAL HISTORY: Past Medical History:  Diagnosis Date    Anxiety attack    Asthma    Herpes    Migraines    Sickle-cell trait (HCC)     PAST SURGICAL HISTORY: Past Surgical History:  Procedure Laterality Date   TONSILLECTOMY     WISDOM TOOTH EXTRACTION      ALLERGIES: Allergies  Allergen Reactions   Hydrocodone Nausea And Vomiting    FAMILY HISTORY: Family History  Problem Relation Age of Onset   Bone cancer Mother    Heart murmur Brother     SOCIAL HISTORY: Social History   Tobacco Use   Smoking status: Never    Passive exposure: Past   Smokeless tobacco: Never  Vaping Use   Vaping status: Never Used  Substance Use Topics   Alcohol use: No    Alcohol/week: 0.0 standard drinks of alcohol    Comment: social   Drug use: No   Social History   Social History Narrative   Are you right handed or left handed? Right   Are you currently employed ? 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 mother   What type of home do you live in: 1 story or 2 story? one    Caffiene occas      Objective:  Vital Signs:  BP 111/77 (BP Location: Left Arm, Patient Position: Sitting, Cuff Size: Normal)   Pulse 70   Ht 5' 2 (1.575 m)   Wt 126 lb (57.2 kg)   SpO2 100%   BMI 23.05 kg/m   General: No acute distress.  Patient appears well-groomed.   Head:  Normocephalic/atraumatic Neck: supple, no paraspinal tenderness, full range of motion Lungs: Non-labored breathing on room air   Neurological Exam: Mental status: alert and oriented, speech fluent and not dysarthric, language intact.  Cranial nerves: CN I: not tested CN II: pupils equal, round and reactive to light, visual fields intact CN III, IV, VI:  full range of motion, no nystagmus, no ptosis CN V: facial sensation intact. CN VII: upper and lower face symmetric CN VIII: hearing intact CN IX, X: uvula midline CN XI: sternocleidomastoid and trapezius muscles intact CN XII: tongue midline  Bulk & Tone: normal,  no fasciculations. Motor:  muscle strength 5/5 throughout, except bilateral APB which is 4+/5 bilaterally Deep Tendon Reflexes:  2+ throughout,  toes downgoing.   Sensation:  Pinprick sensation intact. Finger to nose testing:  Without dysmetria.     Gait:  Normal station and stride.   Labs and Imaging review: New results: EMG (05/01/23): NCV & EMG Findings: Extensive electrodiagnostic evaluation of bilateral upper limbs shows: Right median sensory response shows a prolonged distal peak latency (3.5 ms). Left median-ulnar palmar sensory response shows prolonged distal peak latency (Median Palm-Wrist, 2.7 ms) and abnormal peak latency difference ((Median Palm-Wrist)-(Ulnar Palm-Wrist), 0.77 ms). Left median, bilateral ulnar, and bilateral radial sensory responses are within normal limits. Bilateral median (APB) and ulnar (ADM) motor responses are within normal limits.  There is no evidence of active or chronic motor axon loss changes affecting any of the tested muscles on needle examination. Motor unit configuration and recruitment pattern is within normal limits.   Impression: This is an abnormal study. The findings are most consistent with the following: Evidence of bilateral median mononeuropathy at or distal to the wrist, consistent with carpal tunnel syndrome, mild in degree electrically on the right and very mild in degree electrically on the left. No electrodiagnostic evidence of a right or left cervical (C5-C8) motor radiculopathy. Screening studies for right or left ulnar or radial mononeuropathies are normal.  Previously reviewed results: 03/18/23: CK 181 CBC w/ diff unremarkable CMP significant for K 3.1   12/19/22: CCP ab neg RF neg   Imaging: Left hand xray (03/18/23): FINDINGS: There is no evidence of fracture or dislocation. There is no evidence of arthropathy or other focal bone abnormality. Soft tissues are unremarkable.   IMPRESSION: Negative.   MRI brain w/wo  contrast (03/18/23): FINDINGS: Brain: There is no acute intracranial hemorrhage, extra-axial fluid collection, or acute infarct.   Parenchymal volume is normal. The ventricles are normal in size. Parenchymal signal is normal, with no acute finding or evidence of demyelinating disease.   The pituitary and suprasellar region are normal. There is no mass lesion or abnormal enhancement. There is no mass effect or midline shift.   Vascular: Normal flow voids.   Skull and upper cervical spine: Normal marrow signal.   Sinuses/Orbits: The paranasal sinuses are clear. The globes and orbits are unremarkable.   Other: The mastoid air cells and middle ear cavities are clear.   IMPRESSION: Normal brain MRI.   MRI cervical spine w/wo contrast (03/18/23): FINDINGS: Alignment: Normal.   Vertebrae: Vertebral body heights are preserved. Background marrow signal is normal. There is no suspicious marrow signal abnormality or marrow edema.   Cord: Normal in signal and morphology without abnormal enhancement.   Posterior Fossa, vertebral arteries, paraspinal tissues: The posterior fossa is assessed on the separately dictated brain MRI. The vertebral artery flow voids are normal. The paraspinal soft tissues are unremarkable.   Disc levels:   There is a minimal disc protrusion at C5-C6. There is a small disc protrusion and minimal degenerative endplate change at C6-C7. There is no other significant degenerative change. There is no significant spinal canal or neural foraminal stenosis at any level.   IMPRESSION: No cord signal abnormality or evidence of nerve root impingement to explain the patient's symptoms.  Assessment/Plan:  This is Kathleen Spears, a 27 y.o. female with bilateral carpal tunnel syndrome, confirmed on EMG, mild on right and very mild on left electrically. She has not improved with wrist braces and conservative management. She has significant pain, particularly on the  right hand and would like to be referred to surgery.   Plan: -Will refer to hand surgeon for consideration of CTS release, particularly on right side -Continue wrist splints -Patient wants to continue gabapentin  300 mg TID  Return to clinic as needed   Venetia Potters, MD

## 2023-05-25 ENCOUNTER — Encounter: Payer: Self-pay | Admitting: Neurology

## 2023-05-25 ENCOUNTER — Ambulatory Visit (INDEPENDENT_AMBULATORY_CARE_PROVIDER_SITE_OTHER): Payer: Medicaid Other | Admitting: Neurology

## 2023-05-25 VITALS — BP 111/77 | HR 70 | Ht 62.0 in | Wt 126.0 lb

## 2023-05-25 DIAGNOSIS — G5603 Carpal tunnel syndrome, bilateral upper limbs: Secondary | ICD-10-CM | POA: Diagnosis not present

## 2023-05-25 DIAGNOSIS — M79641 Pain in right hand: Secondary | ICD-10-CM | POA: Diagnosis not present

## 2023-05-25 DIAGNOSIS — M7989 Other specified soft tissue disorders: Secondary | ICD-10-CM | POA: Diagnosis not present

## 2023-05-25 DIAGNOSIS — M79642 Pain in left hand: Secondary | ICD-10-CM

## 2023-05-25 NOTE — Patient Instructions (Addendum)
 I will refer you to a hand surgeon today for consideration of carpal tunnel release surgery. Let me know if you do not hear from someone in 1-2 weeks.  I want you to continue the wrist splints.  Continue the gabapentin  300 mg three times a day.  Please let me know if you have any questions or concerns in the meantime.  The physicians and staff at [facility] are committed to providing excellent care. You may receive a survey requesting feedback about your experience at our office. We strive to receive very good responses to the survey questions. If you feel that your experience would prevent you from giving the office a very good  response, please contact our office to try to remedy the situation. We may be reached at [phone]. Thank you for taking the time out of your busy day to complete the survey.  Venetia Potters, MD [facility]

## 2023-05-25 NOTE — Addendum Note (Signed)
 Addended by: Lenise Herald on: 05/25/2023 11:38 AM   Modules accepted: Orders

## 2023-06-06 ENCOUNTER — Ambulatory Visit (HOSPITAL_COMMUNITY)
Admission: EM | Admit: 2023-06-06 | Discharge: 2023-06-06 | Disposition: A | Discharge status: Home / Self Care | Payer: Medicaid Other | Attending: Emergency Medicine | Admitting: Emergency Medicine

## 2023-06-06 ENCOUNTER — Encounter (HOSPITAL_COMMUNITY): Payer: Self-pay | Admitting: Emergency Medicine

## 2023-06-06 ENCOUNTER — Other Ambulatory Visit: Payer: Self-pay

## 2023-06-06 DIAGNOSIS — J101 Influenza due to other identified influenza virus with other respiratory manifestations: Secondary | ICD-10-CM | POA: Diagnosis not present

## 2023-06-06 DIAGNOSIS — J4520 Mild intermittent asthma, uncomplicated: Secondary | ICD-10-CM

## 2023-06-06 LAB — POC COVID19/FLU A&B COMBO
Covid Antigen, POC: NEGATIVE
Influenza A Antigen, POC: POSITIVE — AB
Influenza B Antigen, POC: NEGATIVE

## 2023-06-06 MED ORDER — ALBUTEROL SULFATE (2.5 MG/3ML) 0.083% IN NEBU
INHALATION_SOLUTION | RESPIRATORY_TRACT | Status: AC
  Filled 2023-06-06: qty 3

## 2023-06-06 MED ORDER — ALBUTEROL SULFATE (2.5 MG/3ML) 0.083% IN NEBU
2.5000 mg | INHALATION_SOLUTION | Freq: Once | RESPIRATORY_TRACT | Status: AC
  Administered 2023-06-06: 2.5 mg via RESPIRATORY_TRACT

## 2023-06-06 MED ORDER — ALBUTEROL SULFATE HFA 108 (90 BASE) MCG/ACT IN AERS: 1.0000 | INHALATION_SPRAY | Freq: Four times a day (QID) | RESPIRATORY_TRACT | 0 refills | Status: AC | PRN

## 2023-06-06 NOTE — ED Provider Notes (Signed)
[facility]    CSN: [id number] Arrival date & time: 06/06/23  1358      History   Chief Complaint Chief Complaint  Patient presents with   Cough    HPI Kathleen Spears is a 28 y.o. female. Started coughing evening of 06/02/23. 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 Coughing worse the next day and has continued to be bothersome. Had severe body aches on 06/03/23 for which she took tylenol; no body aches now except chest hurts when she coughs. No other treatments. No fever. Does have some runny nose and post nasal drainage. Is worried she could have RSV or flu. Most of the children in her class are out for illness, she doesn't know with what. She feels short of breath; has hx of asthma and doesn't have an inhaler   Cough   Past Medical History:  Diagnosis Date   Anxiety attack    Asthma    Herpes    Migraines    Sickle-cell trait [facility])     Patient Active Problem List   Diagnosis Date Noted   Finger pain, left 12/19/2022   Sore throat 09/02/2020   Sickle cell trait (HCC) 07/31/2019   GERD (gastroesophageal reflux disease) 07/31/2019   Anxiety 06/19/2019   Asthma 07/08/2015   Migraine without aura and without status migrainosus, not intractable 05/02/2014    Past Surgical History:  Procedure Laterality Date   TONSILLECTOMY     WISDOM TOOTH EXTRACTION      OB History     Gravida  1   Para  1   Term  1   Preterm      AB      Living  1      SAB      IAB      Ectopic      Multiple  0   Live Births  1            Home Medications    Prior to Admission medications   Medication Sig Start Date End Date Taking? Authorizing Provider  albuterol (VENTOLIN HFA) 108 (90 Base) MCG/ACT inhaler Inhale 1-2 puffs into the lungs every 6 (six) hours as needed for wheezing or shortness of breath. 06/06/23   Cathlyn Parsons, NP  amitriptyline (ELAVIL) 10 MG tablet Take 10 mg by mouth at bedtime. 04/10/23   [provider]  cetirizine  (ZYRTEC) 10 MG tablet Take 1 tablet (10 mg total) by mouth daily. 09/02/20   Storm Frisk, MD  gabapentin (NEURONTIN) 300 MG capsule Take 1 capsule (300 mg total) by mouth 3 (three) times daily. 04/27/23   Antony Madura, MD  potassium chloride SA (KLOR-CON M) 20 MEQ tablet Take 1 tablet (20 mEq total) by mouth daily. Patient not taking: Reported on 05/25/2023 03/18/23   Arthor Captain, PA-C    Family History Family History  Problem Relation Age of Onset   Bone cancer Mother    Heart murmur Brother     Social History Social History   Tobacco Use   Smoking status: Never    Passive exposure: Past   Smokeless tobacco: Never  Vaping Use   Vaping status: Never Used  Substance Use Topics   Alcohol use: No    Alcohol/week: 0.0 standard drinks of alcohol    Comment: social   Drug use: No     Allergies   Hydrocodone   Review of Systems Review of Systems  Respiratory:  Positive for cough.  Physical Exam Triage Vital Signs ED Triage Vitals  Encounter Vitals Group     BP 06/06/23 1415 114/73     Systolic BP Percentile --      Diastolic BP Percentile --      Pulse Rate 06/06/23 1415 85     Resp 06/06/23 1415 18     Temp 06/06/23 1415 98.1 F (36.7 C)     Temp Source 06/06/23 1415 Oral     SpO2 06/06/23 1415 97 %     Weight --      Height --      Head Circumference --      Peak Flow --      Pain Score 06/06/23 1413 3     Pain Loc --      Pain Education --      Exclude from Growth Chart --    No data found.  Updated Vital Signs BP 114/73 (BP Location: Left Arm)   Pulse 85   Temp 98.1 F (36.7 C) (Oral)   Resp 18   LMP 05/27/2023   SpO2 97%   Visual Acuity Right Eye Distance:   Left Eye Distance:   Bilateral Distance:    Right Eye Near:   Left Eye Near:    Bilateral Near:     Physical Exam Constitutional:      Appearance: Normal appearance. She is not ill-appearing.  HENT:     Nose: Rhinorrhea present.  Cardiovascular:     Rate and Rhythm:  Normal rate and regular rhythm.  Pulmonary:     Effort: Pulmonary effort is normal.     Breath sounds: Normal breath sounds.     Comments: Pt has wet sounding cough Neurological:     Mental Status: She is alert.      UC Treatments / Results  Labs (all labs ordered are listed, but only abnormal results are displayed) Labs Reviewed  POC COVID19/FLU A&B COMBO - Abnormal; Notable for the following components:      Result Value   Influenza A Antigen, POC Positive (*)    All other components within normal limits    EKG   Radiology No results found.  Procedures Procedures (including critical care time)  Medications Ordered in UC Medications  albuterol (PROVENTIL) (2.5 MG/3ML) 0.083% nebulizer solution 2.5 mg (2.5 mg Nebulization Given 06/06/23 1444)    Initial Impression / Assessment and Plan / UC Course  I have reviewed the triage vital signs and the nursing notes.  Pertinent labs & imaging results that were available during my care of the patient were reviewed by me and considered in my medical decision making (see chart for details).   I did not hear wheezing; gave albuterol by neb for pt sx of sob; pt felt much better after neb, lungs still clear. Taught huff coughing.   Positive for flu a. Pt outside of window for tamiflu. Pt reports she doesn't feel that poorly. Discussed supportive care options.    Final Clinical Impressions(s) / UC Diagnoses   Final diagnoses:  Influenza A     Discharge Instructions      Use over the counter medicines to help relieve your symptoms and help you until you get better.      ED Prescriptions     Medication Sig Dispense Auth. Provider   albuterol (VENTOLIN HFA) 108 (90 Base) MCG/ACT inhaler Inhale 1-2 puffs into the lungs every 6 (six) hours as needed for wheezing or shortness of breath. 18 g Cathlyn Parsons,  NP      PDMP not reviewed this encounter.   Cathlyn Parsons, NP 06/06/23 [phone]

## 2023-06-06 NOTE — ED Notes (Signed)
Reviewed content of work note

## 2023-06-06 NOTE — Discharge Instructions (Signed)
Use over the counter medicines to help relieve your symptoms and help you until you get better.

## 2023-06-06 NOTE — ED Triage Notes (Signed)
Cough started Saturday.  Throat and chest continues to hurt with coughing.  Patient has been taking tylenol,  patient denies coughing up any phlegm.  Denies any other symptoms

## 2023-06-21 ENCOUNTER — Telehealth: Payer: Self-pay | Admitting: Neurology

## 2023-06-21 NOTE — Telephone Encounter (Signed)
 Pt states she has not heard from anyone regarding her referral for ortho. Is she supposed to f/u with them or is our office

## 2023-06-21 NOTE — Telephone Encounter (Signed)
 Called [employer] and they called pt and left a message with no return. Called pt and informed her of this and gave the phone number to her to call them. She understood.

## 2023-06-25 ENCOUNTER — Ambulatory Visit
Admission: EM | Admit: 2023-06-25 | Discharge: 2023-06-25 | Disposition: A | Discharge status: Home / Self Care | Payer: Medicaid Other | Attending: Family Medicine | Admitting: Family Medicine

## 2023-06-25 DIAGNOSIS — N898 Other specified noninflammatory disorders of vagina: Secondary | ICD-10-CM | POA: Diagnosis not present

## 2023-06-25 DIAGNOSIS — R82998 Other abnormal findings in urine: Secondary | ICD-10-CM

## 2023-06-25 DIAGNOSIS — R300 Dysuria: Secondary | ICD-10-CM

## 2023-06-25 LAB — POCT URINALYSIS DIP (MANUAL ENTRY)
Bilirubin, UA: NEGATIVE
Glucose, UA: NEGATIVE mg/dL
Ketones, POC UA: NEGATIVE mg/dL
Nitrite, UA: NEGATIVE
Protein Ur, POC: NEGATIVE mg/dL
Spec Grav, UA: >=1.03 — AB (ref 1.010–1.025)
Urobilinogen, UA: 0.2 U/dL
pH, UA: 5.5 (ref 5.0–8.0)

## 2023-06-25 LAB — POCT URINE PREGNANCY: Preg Test, Ur: NEGATIVE

## 2023-06-25 MED ORDER — FLUCONAZOLE 150 MG PO TABS: 150.0000 mg | ORAL_TABLET | Freq: Every day | ORAL | 0 refills | Status: AC

## 2023-06-25 NOTE — ED Triage Notes (Signed)
 Pt presents with co vaginal itching and irritation x 1 wk.   Pt denies abd and back pain

## 2023-06-25 NOTE — Discharge Instructions (Signed)
 Start Diflucan  as prescribed.  The clinical contact you with results of the vaginal swab as well as urine culture done today if positive.  Increase fluids and rest.  Follow-up with your gynecologist or PCP if symptoms do not improve.  Please go to the ER for any worsening symptoms.  Hope you feel better soon!

## 2023-06-25 NOTE — ED Provider Notes (Signed)
 [facility]    CSN: [id number] Arrival date & time: 06/25/23  1049      History   Chief Complaint Chief Complaint  Patient presents with   Vaginal Discharge    HPI Kathleen Spears is a 28 y.o. female presents for vaginal irritation.  Patient reports vaginal irritation/itching for 4 to 5 days without discharge.  Does have some mild dysuria but denies urgency, frequency, hematuria, fevers, nausea/vomiting, flank pain.  No STD exposure.  She was treated a couple weeks ago for trichomonas with metronidazole  and thinks this is caused her to have a yeast infection.  No OTC medications have been used since onset.  No other concerns at this time.   Vaginal Discharge Associated symptoms: dysuria     Past Medical History:  Diagnosis Date   Anxiety attack    Asthma    Herpes    Migraines    Sickle-cell trait [facility])     Patient Active Problem List   Diagnosis Date Noted   Finger pain, left 12/19/2022   Sore throat 09/02/2020   Sickle cell trait (HCC) 07/31/2019   GERD (gastroesophageal reflux disease) 07/31/2019   Anxiety 06/19/2019   Asthma 07/08/2015   Migraine without aura and without status migrainosus, not intractable 05/02/2014    Past Surgical History:  Procedure Laterality Date   TONSILLECTOMY     WISDOM TOOTH EXTRACTION      OB History     Gravida  1   Para  1   Term  1   Preterm      AB      Living  1      SAB      IAB      Ectopic      Multiple  0   Live Births  1            Home Medications    Prior to Admission medications   Medication Sig Start Date End Date Taking? Authorizing Provider  fluconazole  (DIFLUCAN ) 150 MG tablet Take 1 tablet (150 mg total) by mouth daily for 2 doses. Take 1 tablet today and you may repeat in 3 days if symptoms persist 06/25/23 06/27/23 Yes Brody Bonneau, Jodi R, NP  albuterol  (VENTOLIN  HFA) 108 (90 Base) MCG/ACT inhaler Inhale 1-2 puffs into the lungs every 6 (six) hours as needed for wheezing or  shortness of breath. 06/06/23   Richad Jon CHRISTELLA, NP  amitriptyline (ELAVIL) 10 MG tablet Take 10 mg by mouth at bedtime. 04/10/23   [provider]  cetirizine  (ZYRTEC ) 10 MG tablet Take 1 tablet (10 mg total) by mouth daily. 09/02/20   Brien Belvie BRAVO, MD  gabapentin  (NEURONTIN ) 300 MG capsule Take 1 capsule (300 mg total) by mouth 3 (three) times daily. 04/27/23   Leigh Venetia CROME, MD  potassium chloride  SA (KLOR-CON  M) 20 MEQ tablet Take 1 tablet (20 mEq total) by mouth daily. Patient not taking: Reported on 05/25/2023 03/18/23   Arloa Chroman, PA-C    Family History Family History  Problem Relation Age of Onset   Bone cancer Mother    Heart murmur Brother     Social History Social History   Tobacco Use   Smoking status: Never    Passive exposure: Past   Smokeless tobacco: Never  Vaping Use   Vaping status: Never Used  Substance Use Topics   Alcohol use: No    Alcohol/week: 0.0 standard drinks of alcohol    Comment: social   Drug use:  No     Allergies   Hydrocodone   Review of Systems Review of Systems  Genitourinary:  Positive for dysuria and vaginal discharge.     Physical Exam Triage Vital Signs ED Triage Vitals  Encounter Vitals Group     BP 06/25/23 1220 103/64     Systolic BP Percentile --      Diastolic BP Percentile --      Pulse Rate 06/25/23 1220 75     Resp 06/25/23 1220 18     Temp 06/25/23 1220 98.1 F (36.7 C)     Temp Source 06/25/23 1220 Oral     SpO2 06/25/23 1220 98 %     Weight --      Height --      Head Circumference --      Peak Flow --      Pain Score 06/25/23 1217 0     Pain Loc --      Pain Education --      Exclude from Growth Chart --    No data found.  Updated Vital Signs BP 103/64 (BP Location: Left Arm)   Pulse 75   Temp 98.1 F (36.7 C) (Oral)   Resp 18   LMP 06/24/2023 (Exact Date)   SpO2 98%   Visual Acuity Right Eye Distance:   Left Eye Distance:   Bilateral Distance:    Right Eye Near:   Left  Eye Near:    Bilateral Near:     Physical Exam Vitals and nursing note reviewed.  Constitutional:      Appearance: Normal appearance.  HENT:     Head: Normocephalic and atraumatic.  Eyes:     Pupils: Pupils are equal, round, and reactive to light.  Cardiovascular:     Rate and Rhythm: Normal rate.  Pulmonary:     Effort: Pulmonary effort is normal.  Abdominal:     Tenderness: There is no right CVA tenderness or left CVA tenderness.  Skin:    General: Skin is warm and dry.  Neurological:     General: No focal deficit present.     Mental Status: She is alert and oriented to person, place, and time.  Psychiatric:        Mood and Affect: Mood normal.        Behavior: Behavior normal.      UC Treatments / Results  Labs (all labs ordered are listed, but only abnormal results are displayed) Labs Reviewed  POCT URINALYSIS DIP (MANUAL ENTRY) - Abnormal; Notable for the following components:      Result Value   Clarity, UA cloudy (*)    Spec Grav, UA >=1.030 (*)    Blood, UA trace-intact (*)    Leukocytes, UA Small (1+) (*)    All other components within normal limits  URINE CULTURE  POCT URINE PREGNANCY  CERVICOVAGINAL ANCILLARY ONLY    EKG   Radiology No results found.  Procedures Procedures (including critical care time)  Medications Ordered in UC Medications - No data to display  Initial Impression / Assessment and Plan / UC Course  I have reviewed the triage vital signs and the nursing notes.  Pertinent labs & imaging results that were available during my care of the patient were reviewed by me and considered in my medical decision making (see chart for details).     Reviewed exam and symptoms with patient.  No red flags.  Urine with trace blood and small leuks.  Will send for urine  culture and patient prefers to await results prior to initiating treatment.  Vaginal swab is ordered and will contact for any positive results.  Will start Diflucan  for yeast  infection.  Encouraged rest and fluids.  PCP follow-up if symptoms do not improve.  ER precautions reviewed and patient verbalized understanding. Final Clinical Impressions(s) / UC Diagnoses   Final diagnoses:  Dysuria  Vaginal itching  Leukocytes in urine     Discharge Instructions      Start Diflucan  as prescribed.  The clinical contact you with results of the vaginal swab as well as urine culture done today if positive.  Increase fluids and rest.  Follow-up with your gynecologist or PCP if symptoms do not improve.  Please go to the ER for any worsening symptoms.  Hope you feel better soon!     ED Prescriptions     Medication Sig Dispense Auth. Provider   fluconazole  (DIFLUCAN ) 150 MG tablet Take 1 tablet (150 mg total) by mouth daily for 2 doses. Take 1 tablet today and you may repeat in 3 days if symptoms persist 2 tablet Amil Moseman, Jodi R, NP      PDMP not reviewed this encounter.   Loreda Myla SAUNDERS, NP 06/25/23 [phone]

## 2023-06-26 LAB — URINE CULTURE: Culture: NO GROWTH

## 2023-06-26 LAB — CERVICOVAGINAL ANCILLARY ONLY
Bacterial Vaginitis (gardnerella): POSITIVE — AB
Candida Glabrata: NEGATIVE
Candida Vaginitis: POSITIVE — AB
Comment: NEGATIVE
Comment: NEGATIVE
Comment: NEGATIVE

## 2023-06-27 ENCOUNTER — Telehealth (HOSPITAL_BASED_OUTPATIENT_CLINIC_OR_DEPARTMENT_OTHER): Payer: Self-pay

## 2023-06-27 MED ORDER — METRONIDAZOLE 500 MG PO TABS: 500.0000 mg | ORAL_TABLET | Freq: Two times a day (BID) | ORAL | 0 refills | Status: AC

## 2023-06-27 NOTE — Telephone Encounter (Signed)
Per protocol, pt requires tx with metronidazole. Rx sent to pharmacy on file.

## 2024-01-29 ENCOUNTER — Encounter (HOSPITAL_COMMUNITY): Payer: Self-pay

## 2024-01-29 ENCOUNTER — Emergency Department (HOSPITAL_COMMUNITY)
Admission: EM | Admit: 2024-01-29 | Discharge: 2024-01-30 | Disposition: A | Discharge status: Home / Self Care | Source: Ambulatory Visit | Attending: Emergency Medicine | Admitting: Emergency Medicine

## 2024-01-29 ENCOUNTER — Emergency Department (HOSPITAL_COMMUNITY)

## 2024-01-29 ENCOUNTER — Other Ambulatory Visit: Payer: Self-pay

## 2024-01-29 ENCOUNTER — Ambulatory Visit (HOSPITAL_COMMUNITY)
Admission: EM | Admit: 2024-01-29 | Discharge: 2024-01-29 | Disposition: A | Discharge status: Home / Self Care | Attending: Emergency Medicine | Admitting: Emergency Medicine

## 2024-01-29 DIAGNOSIS — R1031 Right lower quadrant pain: Secondary | ICD-10-CM | POA: Diagnosis present

## 2024-01-29 DIAGNOSIS — J45909 Unspecified asthma, uncomplicated: Secondary | ICD-10-CM | POA: Insufficient documentation

## 2024-01-29 DIAGNOSIS — E282 Polycystic ovarian syndrome: Secondary | ICD-10-CM | POA: Insufficient documentation

## 2024-01-29 LAB — COMPREHENSIVE METABOLIC PANEL WITH GFR
ALT: 12 U/L (ref 0–44)
AST: 19 U/L (ref 15–41)
Albumin: 4.7 g/dL (ref 3.5–5.0)
Alkaline Phosphatase: 64 U/L (ref 38–126)
Anion gap: 10 (ref 5–15)
BUN: 8 mg/dL (ref 6–20)
CO2: 25 mmol/L (ref 22–32)
Calcium: 9.9 mg/dL (ref 8.9–10.3)
Chloride: 101 mmol/L (ref 98–111)
Creatinine, Ser: 0.62 mg/dL (ref 0.44–1.00)
GFR, Estimated: >60 mL/min (ref >60)
Glucose, Bld: 86 mg/dL (ref 70–99)
Potassium: 3.6 mmol/L (ref 3.5–5.1)
Sodium: 136 mmol/L (ref 135–145)
Total Bilirubin: 0.6 mg/dL (ref 0.0–1.2)
Total Protein: 7.8 g/dL (ref 6.5–8.1)

## 2024-01-29 LAB — CBC
HCT: 41.5 % (ref 36.0–46.0)
Hemoglobin: 13.8 g/dL (ref 12.0–15.0)
MCH: 27.3 pg (ref 26.0–34.0)
MCHC: 33.3 g/dL (ref 30.0–36.0)
MCV: 82 fL (ref 80.0–100.0)
Platelets: 239 K/uL (ref 150–400)
RBC: 5.06 MIL/uL (ref 3.87–5.11)
RDW: 13.1 % (ref 11.5–15.5)
WBC: 5.6 K/uL (ref 4.0–10.5)
nRBC: 0 % (ref 0.0–0.2)

## 2024-01-29 LAB — POCT URINALYSIS DIP (MANUAL ENTRY)
Bilirubin, UA: NEGATIVE
Blood, UA: NEGATIVE
Glucose, UA: NEGATIVE mg/dL
Ketones, POC UA: NEGATIVE mg/dL
Nitrite, UA: NEGATIVE
Protein Ur, POC: NEGATIVE mg/dL
Spec Grav, UA: 1.025 (ref 1.010–1.025)
Urobilinogen, UA: 1 U/dL
pH, UA: 5.5 (ref 5.0–8.0)

## 2024-01-29 LAB — POCT URINE PREGNANCY: Preg Test, Ur: NEGATIVE

## 2024-01-29 LAB — HCG, SERUM, QUALITATIVE: Preg, Serum: NEGATIVE

## 2024-01-29 LAB — LIPASE, BLOOD: Lipase: 36 U/L (ref 11–51)

## 2024-01-29 MED ORDER — IOHEXOL 300 MG/ML  SOLN
100.0000 mL | Freq: Once | INTRAMUSCULAR | Status: AC | PRN
Start: 2024-01-29 — End: 2024-01-29
  Administered 2024-01-29: 100 mL via INTRAVENOUS

## 2024-01-29 MED ORDER — ONDANSETRON HCL 4 MG/2ML IJ SOLN
4.0000 mg | Freq: Once | INTRAMUSCULAR | Status: AC
Start: 2024-01-29 — End: 2024-01-29
  Administered 2024-01-29: 4 mg via INTRAVENOUS
  Filled 2024-01-29: qty 2

## 2024-01-29 MED ORDER — MORPHINE SULFATE (PF) 4 MG/ML IV SOLN
4.0000 mg | Freq: Once | INTRAVENOUS | Status: AC
  Administered 2024-01-29: 4 mg via INTRAVENOUS
  Filled 2024-01-29: qty 1

## 2024-01-29 NOTE — ED Notes (Signed)
 Patient is being discharged from the Urgent Care and sent to the Emergency Department via private vehicle . Per provider, patient is in need of higher level of care due to RLQ abd pain. Patient is aware and verbalizes understanding of plan of care.  Vitals:   01/29/24 1715  BP: 114/75  Pulse: 60  Resp: 16  Temp: 98.1 F (36.7 C)  SpO2: 98%

## 2024-01-29 NOTE — ED Triage Notes (Signed)
 Patient here today with c/o right side abd pain that started this morning at about 7:30. Patient states that she is also having some low back pain that has worsened today but she has a h/o low back pain. Patient has taken Tylenol  with no relief. Patient has had some nausea X 2 weeks.

## 2024-01-29 NOTE — ED Provider Notes (Signed)
 [facility] Provider Note   CSN: [id number] Arrival date & time: 01/29/24  1827     Patient presents with: Abdominal Pain   Kathleen Spears is a 28 y.o. female with PMHx asthma, migraines, GERD who presents to ED concerned for RLQ pain and nausea starting this morning. Denies vomiting or diarrhea. Last BM 3-4 days ago. LMP 01/25/2024. Patient took Tylenol  without relief in pain. Denies fever. Pain is worsening. Denies dysuria, hematuria. Denies vaginal discharge.   {Add pertinent medical, surgical, social history, OB history to HPI:32947}  Abdominal Pain      Prior to Admission medications   Medication Sig Start Date End Date Taking? Authorizing Provider  albuterol  (VENTOLIN  HFA) 108 (90 Base) MCG/ACT inhaler Inhale 1-2 puffs into the lungs every 6 (six) hours as needed for wheezing or shortness of breath. 06/06/23   Richad Jon CHRISTELLA, NP  amitriptyline (ELAVIL) 10 MG tablet Take 10 mg by mouth at bedtime. 04/10/23   [provider]  cetirizine  (ZYRTEC ) 10 MG tablet Take 1 tablet (10 mg total) by mouth daily. 09/02/20   Brien Belvie BRAVO, MD  potassium chloride  SA (KLOR-CON  M) 20 MEQ tablet Take 1 tablet (20 mEq total) by mouth daily. Patient not taking: Reported on 05/25/2023 03/18/23   Harris, Abigail, PA-C    Allergies: Hydrocodone    Review of Systems  Gastrointestinal:  Positive for abdominal pain.    Updated Vital Signs BP 112/71 (BP Location: Left Arm)   Pulse 66   Temp 98.3 F (36.8 C) (Oral)   Resp 16   LMP 01/25/2024 (Exact Date)   SpO2 100%   Physical Exam Vitals and nursing note reviewed.  Constitutional:      General: She is not in acute distress.    Appearance: She is not ill-appearing or toxic-appearing.  HENT:     Head: Normocephalic and atraumatic.     Mouth/Throat:     Mouth: Mucous membranes are moist.     Pharynx: No posterior oropharyngeal erythema.  Eyes:     General: No scleral icterus.        Right eye: No discharge.        Left eye: No discharge.     Conjunctiva/sclera: Conjunctivae normal.  Cardiovascular:     Rate and Rhythm: Normal rate and regular rhythm.     Pulses: Normal pulses.     Heart sounds: Normal heart sounds. No murmur heard. Pulmonary:     Effort: Pulmonary effort is normal. No respiratory distress.     Breath sounds: Normal breath sounds. No wheezing, rhonchi or rales.  Abdominal:     General: Abdomen is flat. Bowel sounds are normal. There is no distension.     Palpations: Abdomen is soft. There is no mass.     Tenderness: There is abdominal tenderness in the right lower quadrant.  Musculoskeletal:     Right lower leg: No edema.     Left lower leg: No edema.  Skin:    General: Skin is warm and dry.     Findings: No rash.  Neurological:     General: No focal deficit present.     Mental Status: She is alert and oriented to person, place, and time. Mental status is at baseline.  Psychiatric:        Mood and Affect: Mood normal.        Behavior: Behavior normal.     (all labs ordered are listed, but only abnormal results are  displayed) Labs Reviewed  LIPASE, BLOOD  COMPREHENSIVE METABOLIC PANEL WITH GFR  CBC  HCG, SERUM, QUALITATIVE  URINALYSIS, ROUTINE W REFLEX MICROSCOPIC    EKG: None  Radiology: No results found.  {Document cardiac monitor, telemetry assessment procedure when appropriate:32947} Procedures   Medications Ordered in the ED - No data to display    {Click here for ABCD2, HEART and other calculators REFRESH Note before signing:1}                              Medical Decision Making Amount and/or Complexity of Data Reviewed Labs: ordered. Radiology: ordered.  Risk Prescription drug management.    This patient presents to the ED for concern of abdominal pain, this involves an extensive number of treatment options, and is a complaint that carries with it a high risk of complications and morbidity.  The differential  diagnosis includes gastroenteritis, colitis, small bowel obstruction, appendicitis, cholecystitis, pancreatitis, nephrolithiasis, UTI, pyleonephritis, ruptured ectopic pregnancy, PID, ovarian torsion.   Co morbidities that complicate the patient evaluation  asthma, migraines, GERD    Additional history obtained:  Additional history obtained from 9/15 UC note: patient with RLQ pain starting today and referred to ED   Problem List / ED Course / Critical interventions / Medication management  Patient presented for RLQ and nausea since this morning. Physical exam with RLQ tenderness to palpation. Rest of physical exam reassuring. Patient afebrile with stable vitals.  I Ordered, and personally interpreted labs.  *** I ordered imaging studies including CT Abd/Pelvis with contrast: evaluate for structural/surgical etiology of patients' severe abdominal pain. I independently visualized and interpreted imaging and I agree with the radiologist interpretation of ***. I personally viewed and interpreted the EKG/cardiac monitored which showed an underlying rhythm of: *** I requested consultation with the ***,  and discussed lab and imaging findings as well as pertinent plan - they recommend: *** I have reviewed the patients home medicines and have made adjustments as needed The patient has been appropriately medically screened and/or stabilized in the ED. I have low suspicion for any other emergent medical condition which would require further screening, evaluation or treatment in the ED or require inpatient management. At time of discharge the patient is hemodynamically stable and in no acute distress. I have discussed work-up results and diagnosis with patient and answered all questions. Patient is agreeable with discharge plan. We discussed strict return precautions for returning to the emergency department and they verbalized understanding.     Social Determinants of Health:  none    {Document  critical care time when appropriate  Document review of labs and clinical decision tools ie CHADS2VASC2, etc  Document your independent review of radiology images and any outside records  Document your discussion with family members, caretakers and with consultants  Document social determinants of health affecting pt's care  Document your decision making why or why not admission, treatments were needed:32947:::1}   Final diagnoses:  None    ED Discharge Orders     None

## 2024-01-29 NOTE — ED Triage Notes (Signed)
 Pt presents to ED from UC C/O RLQ pain, nausea starting this AM. Denies vomiting, diarrhea. Last BM 3-4 days ago.

## 2024-01-29 NOTE — ED Provider Notes (Signed)
 [facility]    CSN: [id number] Arrival date & time: 01/29/24  1445      History   Chief Complaint Chief Complaint  Patient presents with   Abdominal Pain    HPI Kathleen Spears is a 28 y.o. female.  Reports right side abdominal pain that started suddenly this morning Rates severe, 10/10 pain. Constant.  Not sure if it radiates to her right low back - reports chronic low back pain  Nausea for 2 weeks, no vomiting No fevers Tolerating fluids, good appetite   Has taken tylenol    Past Medical History:  Diagnosis Date   Anxiety attack    Asthma    Herpes    Migraines    Sickle-cell trait [facility])     Patient Active Problem List   Diagnosis Date Noted   Finger pain, left 12/19/2022   Sore throat 09/02/2020   Sickle cell trait (HCC) 07/31/2019   GERD (gastroesophageal reflux disease) 07/31/2019   Anxiety 06/19/2019   Asthma 07/08/2015   Migraine without aura and without status migrainosus, not intractable 05/02/2014    Past Surgical History:  Procedure Laterality Date   TONSILLECTOMY     WISDOM TOOTH EXTRACTION      OB History     Gravida  1   Para  1   Term  1   Preterm      AB      Living  1      SAB      IAB      Ectopic      Multiple  0   Live Births  1            Home Medications    Prior to Admission medications   Medication Sig Start Date End Date Taking? Authorizing Provider  albuterol  (VENTOLIN  HFA) 108 (90 Base) MCG/ACT inhaler Inhale 1-2 puffs into the lungs every 6 (six) hours as needed for wheezing or shortness of breath. 06/06/23   Richad Jon CHRISTELLA, NP  amitriptyline (ELAVIL) 10 MG tablet Take 10 mg by mouth at bedtime. 04/10/23   [provider]  cetirizine  (ZYRTEC ) 10 MG tablet Take 1 tablet (10 mg total) by mouth daily. 09/02/20   Brien Belvie BRAVO, MD  potassium chloride  SA (KLOR-CON  M) 20 MEQ tablet Take 1 tablet (20 mEq total) by mouth daily. Patient not taking: Reported on 05/25/2023 03/18/23    Arloa Chroman, PA-C    Family History Family History  Problem Relation Age of Onset   Bone cancer Mother    Heart murmur Brother     Social History Social History   Tobacco Use   Smoking status: Never    Passive exposure: Past   Smokeless tobacco: Never  Vaping Use   Vaping status: Never Used  Substance Use Topics   Alcohol use: No    Alcohol/week: 0.0 standard drinks of alcohol    Comment: social   Drug use: No     Allergies   Hydrocodone   Review of Systems Review of Systems  Gastrointestinal:  Positive for abdominal pain.   As per HPI  Physical Exam Triage Vital Signs ED Triage Vitals  Encounter Vitals Group     BP 01/29/24 1715 114/75     Girls Systolic BP Percentile --      Girls Diastolic BP Percentile --      Boys Systolic BP Percentile --      Boys Diastolic BP Percentile --      Pulse Rate  01/29/24 1715 60     Resp 01/29/24 1715 16     Temp 01/29/24 1715 98.1 F (36.7 C)     Temp Source 01/29/24 1715 Oral     SpO2 01/29/24 1715 98 %     Weight --      Height --      Head Circumference --      Peak Flow --      Pain Score 01/29/24 1714 10     Pain Loc --      Pain Education --      Exclude from Growth Chart --    No data found.  Updated Vital Signs BP 114/75 (BP Location: Left Arm)   Pulse 60   Temp 98.1 F (36.7 C) (Oral)   Resp 16   LMP 01/25/2024 (Exact Date)   SpO2 98%    Physical Exam Vitals and nursing note reviewed.  Constitutional:      Appearance: Normal appearance.  HENT:     Mouth/Throat:     Mouth: Mucous membranes are moist.     Pharynx: Oropharynx is clear.  Eyes:     Conjunctiva/sclera: Conjunctivae normal.  Cardiovascular:     Rate and Rhythm: Normal rate and regular rhythm.     Heart sounds: Normal heart sounds.  Pulmonary:     Effort: Pulmonary effort is normal. No respiratory distress.     Breath sounds: Normal breath sounds.  Abdominal:     General: Bowel sounds are normal.     Palpations: Abdomen  is soft.     Tenderness: There is abdominal tenderness in the right lower quadrant. There is no right CVA tenderness, left CVA tenderness, guarding or rebound.  Musculoskeletal:        General: Normal range of motion.  Skin:    General: Skin is warm and dry.  Neurological:     Mental Status: She is alert and oriented to person, place, and time.     UC Treatments / Results  Labs (all labs ordered are listed, but only abnormal results are displayed) Labs Reviewed  POCT URINALYSIS DIP (MANUAL ENTRY) - Abnormal; Notable for the following components:      Result Value   Clarity, UA cloudy (*)    Leukocytes, UA Trace (*)    All other components within normal limits  POCT URINE PREGNANCY    EKG  Radiology No results found.  Procedures Procedures (including critical care time)  Medications Ordered in UC Medications - No data to display  Initial Impression / Assessment and Plan / UC Course  I have reviewed the triage vital signs and the nursing notes.  Pertinent labs & imaging results that were available during my care of the patient were reviewed by me and considered in my medical decision making (see chart for details).  UPT negative UA trace leuks otherwise unremarkable   Patient reports 10/10 severe abdominal pain, worse abd pain she's ever had.  I  have advised evaluation in the ED, higher level of care, advanced imaging not available in urgent care  Reports possible PCOS diagnosis by ob/gyn many years ago. Consider ruptured or hemorrhagic cyst. Also cannot r/o appendicitis.  Sent to ED via POV with fiance   Final Clinical Impressions(s) / UC Diagnoses   Final diagnoses:  Right lower quadrant abdominal pain   Discharge Instructions   None    ED Prescriptions   None    PDMP not reviewed this encounter.   Jeryl Stabs, [place] 01/29/24 1750

## 2024-01-30 ENCOUNTER — Emergency Department (HOSPITAL_COMMUNITY)

## 2024-01-30 LAB — URINALYSIS, ROUTINE W REFLEX MICROSCOPIC
Bilirubin Urine: NEGATIVE
Glucose, UA: NEGATIVE mg/dL
Hgb urine dipstick: NEGATIVE
Ketones, ur: 5 mg/dL — AB
Leukocytes,Ua: NEGATIVE
Nitrite: NEGATIVE
Protein, ur: NEGATIVE mg/dL
Specific Gravity, Urine: <1.005 — ABNORMAL LOW (ref 1.005–1.030)
pH: 5 (ref 5.0–8.0)

## 2024-01-30 MED ORDER — MORPHINE SULFATE (PF) 4 MG/ML IV SOLN
4.0000 mg | Freq: Once | INTRAVENOUS | Status: AC
  Administered 2024-01-30: 4 mg via INTRAVENOUS
  Filled 2024-01-30: qty 1

## 2024-01-30 NOTE — Discharge Instructions (Addendum)
 As discussed, you will need to follow-up with primary care and with the [facility] for [facility].  Seek emergency care if experiencing any new or worsening symptoms.  Alternating between 650 mg Tylenol  and 400 mg Advil : The best way to alternate taking Acetaminophen  (example Tylenol ) and Ibuprofen  (example Advil /Motrin ) is to take them 3 hours apart. For example, if you take ibuprofen  at 6 am you can then take Tylenol  at 9 am. You can continue this regimen throughout the day, making sure you do not exceed the recommended maximum dose for each drug.

## 2024-03-12 ENCOUNTER — Other Ambulatory Visit: Payer: Self-pay

## 2024-03-12 ENCOUNTER — Emergency Department (HOSPITAL_COMMUNITY)

## 2024-03-12 ENCOUNTER — Emergency Department (HOSPITAL_COMMUNITY)
Admission: EM | Admit: 2024-03-12 | Discharge: 2024-03-12 | Disposition: A | Discharge status: Home / Self Care | Attending: Emergency Medicine | Admitting: Emergency Medicine

## 2024-03-12 ENCOUNTER — Encounter (HOSPITAL_COMMUNITY): Payer: Self-pay | Admitting: Emergency Medicine

## 2024-03-12 DIAGNOSIS — M25531 Pain in right wrist: Secondary | ICD-10-CM | POA: Diagnosis present

## 2024-03-12 DIAGNOSIS — S52591A Other fractures of lower end of right radius, initial encounter for closed fracture: Secondary | ICD-10-CM | POA: Insufficient documentation

## 2024-03-12 DIAGNOSIS — M25511 Pain in right shoulder: Secondary | ICD-10-CM | POA: Diagnosis not present

## 2024-03-12 DIAGNOSIS — Y92410 Unspecified street and highway as the place of occurrence of the external cause: Secondary | ICD-10-CM | POA: Diagnosis not present

## 2024-03-12 DIAGNOSIS — S62310A Displaced fracture of base of second metacarpal bone, right hand, initial encounter for closed fracture: Secondary | ICD-10-CM | POA: Insufficient documentation

## 2024-03-12 MED ORDER — METHOCARBAMOL 500 MG PO TABS: 500.0000 mg | ORAL_TABLET | Freq: Two times a day (BID) | ORAL | 0 refills | Status: AC

## 2024-03-12 MED ORDER — OXYCODONE-ACETAMINOPHEN 5-325 MG PO TABS
1.0000 | ORAL_TABLET | Freq: Once | ORAL | Status: AC
  Administered 2024-03-12: 1 via ORAL
  Filled 2024-03-12: qty 1

## 2024-03-12 MED ORDER — NAPROXEN 500 MG PO TABS: 500.0000 mg | ORAL_TABLET | Freq: Two times a day (BID) | ORAL | 0 refills | Status: DC

## 2024-03-12 MED ORDER — OXYCODONE-ACETAMINOPHEN 5-325 MG PO TABS: 1.0000 | ORAL_TABLET | Freq: Four times a day (QID) | ORAL | 0 refills | Status: AC | PRN

## 2024-03-12 NOTE — Discharge Instructions (Addendum)
 We discussed the of your x-ray on today's visit.  You were given medication to help with pain control, please take this as prescribed.  You are also given the phone number to the hand specialist, please schedule an appointment for a follow-up in the next couple of days.
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
## 2024-03-12 NOTE — ED Notes (Signed)
 Ortho tech at bedside
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
## 2024-03-12 NOTE — Progress Notes (Signed)
 Orthopedic Tech Progress Note Patient Details:  Kathleen Spears 1996-03-02 [id number]  Ortho Devices Type of Ortho Device: Ace wrap, Cotton web roll, Shoulder immobilizer, Volar splint Ortho Device/Splint Location: right volar applied. right sling aapplied Ortho Device/Splint Interventions: Ordered, Application, Adjustment   Post Interventions Patient Tolerated: Well Instructions Provided: Adjustment of device, Care of device  Waylan Thom Loving 03/12/2024, 5:38 PM

## 2024-03-14 ENCOUNTER — Ambulatory Visit (INDEPENDENT_AMBULATORY_CARE_PROVIDER_SITE_OTHER): Admitting: Orthopedic Surgery

## 2024-03-14 DIAGNOSIS — S62300A Unspecified fracture of second metacarpal bone, right hand, initial encounter for closed fracture: Secondary | ICD-10-CM

## 2024-03-14 DIAGNOSIS — S52571A Other intraarticular fracture of lower end of right radius, initial encounter for closed fracture: Secondary | ICD-10-CM
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
## 2024-03-18 ENCOUNTER — Encounter: Payer: Self-pay | Admitting: Radiology

## 2024-03-27 ENCOUNTER — Other Ambulatory Visit: Payer: Self-pay | Admitting: Orthopedic Surgery

## 2024-03-27 ENCOUNTER — Other Ambulatory Visit (INDEPENDENT_AMBULATORY_CARE_PROVIDER_SITE_OTHER)

## 2024-03-27 ENCOUNTER — Ambulatory Visit: Admitting: Orthopedic Surgery

## 2024-03-27 DIAGNOSIS — M79645 Pain in left finger(s): Secondary | ICD-10-CM

## 2024-03-27 DIAGNOSIS — S52571A Other intraarticular fracture of lower end of right radius, initial encounter for closed fracture: Secondary | ICD-10-CM

## 2024-03-27 DIAGNOSIS — S62300A Unspecified fracture of second metacarpal bone, right hand, initial encounter for closed fracture: Secondary | ICD-10-CM

## 2024-03-27 MED ORDER — TRAMADOL HCL 50 MG PO TABS: 50.0000 mg | ORAL_TABLET | Freq: Four times a day (QID) | ORAL | 0 refills | Status: DC | PRN

## 2024-03-27 NOTE — Progress Notes (Signed)
 Kathleen Spears - 28 y.o. female MRN [id number]  Date of birth: 1995/06/06  Office Visit Note: Visit Date: 03/27/2024 PCP: Pcp, No Referred by: No ref. provider found  Subjective: No chief complaint on file.  HPI: Kathleen Spears is a pleasant 28 y.o. female who returns today for follow-up regarding right distal radius fracture and index metacarpal head fracture.  Has been compliant with casting as instructed, pain is currently controlled at rest.  Pertinent ROS were reviewed with the patient and found to be negative unless otherwise specified above in HPI.    Assessment & Plan: Visit Diagnoses:  1. Other closed intra-articular fracture of distal end of right radius, initial encounter   2. Closed nondisplaced fracture of second metacarpal bone of right hand, unspecified portion of metacarpal, initial encounter   3. Pain in left finger(s)     Plan: She is doing well overall.  Fractures demonstrate appropriate stability without significant displacement.  Cast to be replaced today for additional 2 weeks.  Follow-up at that time for potential transition to removable orthosis.  Follow-up: No follow-ups on file.   Meds & Orders: No orders of the defined types were placed in this encounter.   Orders Placed This Encounter  Procedures   XR Wrist Complete Right   Ambulatory referral to Occupational Therapy     Procedures: No procedures performed      Clinical History: No specialty comments available.  She reports that she has never smoked. She has been exposed to tobacco smoke. She has never used smokeless tobacco. No results for input(s): HGBA1C, LABURIC in the last 8760 hours.  Objective:   Vital Signs: There were no vitals taken for this visit.  Physical Exam  Gen: Well-appearing, in no acute distress; non-toxic CV: Regular Rate. Well-perfused. Warm.  Resp: Breathing unlabored on room air; no wheezing. Psych: Fluid speech in conversation; appropriate affect; normal  thought process  Ortho Exam Right upper extremity: - Skin is intact, mild swelling diffusely throughout the wrist, notable tenderness at the distal radius region - Sensation intact distally median/radial/ulnar distributions, AIN/PIN/interosseous intact, hand remains warm well-perfused - Able to form spontaneous motion of the digits, pain limiting at the index finger   Imaging: XR Wrist Complete Right Result Date: 03/27/2024 X-rays of the right wrist demonstrate ongoing nondisplaced fracture of the distal radius without interval displacement in comparison to prior films.  Radial height and volar tilt are well-maintained in all planes.  Previously known index finger metacarpal head fracture is once again visualized without interval displacement.    Past Medical/Family/Surgical/Social History: Medications & Allergies reviewed per EMR, new medications updated. Patient Active Problem List   Diagnosis Date Noted   Finger pain, left 12/19/2022   Sore throat 09/02/2020   Sickle cell trait 07/31/2019   GERD (gastroesophageal reflux disease) 07/31/2019   Anxiety 06/19/2019   Asthma 07/08/2015   Migraine without aura and without status migrainosus, not intractable 05/02/2014   Past Medical History:  Diagnosis Date   Anxiety attack    Asthma    Herpes    Migraines    Sickle-cell trait    Family History  Problem Relation Age of Onset   Bone cancer Mother    Heart murmur Brother    Past Surgical History:  Procedure Laterality Date   TONSILLECTOMY     WISDOM TOOTH EXTRACTION     Social History   Occupational History   Not on file  Tobacco Use   Smoking status: Never  Passive exposure: Past   Smokeless tobacco: Never  Vaping Use   Vaping status: Never Used  Substance and Sexual Activity   Alcohol use: No    Alcohol/week: 0.0 standard drinks of alcohol    Comment: social   Drug use: No   Sexual activity: Yes    Partners: Male    Birth control/protection: None     Frankey Botting Estela) Chas Axel, M.D. [facility], Hand Surgery

## 2024-04-05 ENCOUNTER — Telehealth: Payer: Self-pay | Admitting: Orthopedic Surgery

## 2024-04-05 NOTE — Telephone Encounter (Signed)
 Pt called requesting a letter that she is wearing a cast on her arm and the reason she has a cast. Pt states she is going to visit her brother in prison and need the letter for the facility. Please call pt at [phone].

## 2024-04-09 NOTE — Progress Notes (Unsigned)
 Kathleen Spears - 28 y.o. female MRN [id number]  Date of birth: June 23, 1995  Office Visit Note: Visit Date: 04/10/2024 PCP: Pcp, No Referred by: No ref. provider found  Subjective: No chief complaint on file.  HPI: Kathleen Spears is a pleasant 27 y.o. female who returns today for follow-up 4 weeks out from right distal radius fracture and index metacarpal head fracture.  Has been compliant with casting as instructed, pain is currently controlled at rest.  Pertinent ROS were reviewed with the patient and found to be negative unless otherwise specified above in HPI.    Assessment & Plan: Visit Diagnoses:  1. Other closed intra-articular fracture of distal end of right radius, initial encounter     Plan: She is doing well overall.  Fractures demonstrate appropriate stability without significant displacement.  She will be seen by occupational therapy today for fabrication of her orthosis to immobilize the distal radius and metacarpal head fracture.  She can begin gentle range of motion exercises.  Follow-up in approximately 4 weeks.  Follow-up: No follow-ups on file.   Meds & Orders: No orders of the defined types were placed in this encounter.   Orders Placed This Encounter  Procedures   XR Wrist Complete Right   XR Hand Complete Right     Procedures: No procedures performed      Clinical History: No specialty comments available.  She reports that she has never smoked. She has been exposed to tobacco smoke. She has never used smokeless tobacco. No results for input(s): HGBA1C, LABURIC in the last 8760 hours.  Objective:   Vital Signs: There were no vitals taken for this visit.  Physical Exam  Gen: Well-appearing, in no acute distress; non-toxic CV: Regular Rate. Well-perfused. Warm.  Resp: Breathing unlabored on room air; no wheezing. Psych: Fluid speech in conversation; appropriate affect; normal thought process  Ortho Exam Right upper extremity: - Skin is  intact, mild swelling diffusely throughout the wrist, notable tenderness at the distal radius region - Sensation intact distally median/radial/ulnar distributions, AIN/PIN/interosseous intact, hand remains warm well-perfused - Able to form spontaneous motion of the digits, pain limiting at the index finger   Imaging: XR Hand Complete Right Result Date: 04/10/2024 X-rays of the right hand demonstrate the previously known index finger metacarpal head fracture.  No significant displacement in comparison to previous films.  XR Wrist Complete Right Result Date: 04/10/2024 X-rays of the right hand continues to demonstrate fracture lines consistent with the previous distal radius fracture.  There is notable interval healing without significant displacement in comparison to prior films.  Radial height and volar tilt are well-maintained.     Past Medical/Family/Surgical/Social History: Medications & Allergies reviewed per EMR, new medications updated. Patient Active Problem List   Diagnosis Date Noted   Finger pain, left 12/19/2022   Sore throat 09/02/2020   Sickle cell trait 07/31/2019   GERD (gastroesophageal reflux disease) 07/31/2019   Anxiety 06/19/2019   Asthma 07/08/2015   Migraine without aura and without status migrainosus, not intractable 05/02/2014   Past Medical History:  Diagnosis Date   Anxiety attack    Asthma    Herpes    Migraines    Sickle-cell trait    Family History  Problem Relation Age of Onset   Bone cancer Mother    Heart murmur Brother    Past Surgical History:  Procedure Laterality Date   TONSILLECTOMY     WISDOM TOOTH EXTRACTION     Social History  Occupational History   Not on file  Tobacco Use   Smoking status: Never    Passive exposure: Past   Smokeless tobacco: Never  Vaping Use   Vaping status: Never Used  Substance and Sexual Activity   Alcohol use: No    Alcohol/week: 0.0 standard drinks of alcohol    Comment: social   Drug use:  No   Sexual activity: Yes    Partners: Male    Birth control/protection: None    Ahana Najera Estela) Adison Jerger, M.D. [facility], Hand Surgery

## 2024-04-10 ENCOUNTER — Other Ambulatory Visit: Payer: Self-pay

## 2024-04-10 ENCOUNTER — Ambulatory Visit (HOSPITAL_COMMUNITY): Attending: Orthopedic Surgery | Admitting: Occupational Therapy

## 2024-04-10 ENCOUNTER — Encounter (HOSPITAL_COMMUNITY): Payer: Self-pay | Admitting: Occupational Therapy

## 2024-04-10 ENCOUNTER — Other Ambulatory Visit: Payer: Self-pay | Admitting: Orthopedic Surgery

## 2024-04-10 ENCOUNTER — Ambulatory Visit: Payer: Self-pay

## 2024-04-10 ENCOUNTER — Ambulatory Visit: Admitting: Orthopedic Surgery

## 2024-04-10 DIAGNOSIS — S52571A Other intraarticular fracture of lower end of right radius, initial encounter for closed fracture: Secondary | ICD-10-CM

## 2024-04-10 DIAGNOSIS — M79644 Pain in right finger(s): Secondary | ICD-10-CM | POA: Diagnosis present

## 2024-04-10 DIAGNOSIS — R29898 Other symptoms and signs involving the musculoskeletal system: Secondary | ICD-10-CM | POA: Diagnosis present

## 2024-04-10 DIAGNOSIS — M25531 Pain in right wrist: Secondary | ICD-10-CM | POA: Diagnosis present

## 2024-04-10 MED ORDER — TRAMADOL HCL 50 MG PO TABS: 50.0000 mg | ORAL_TABLET | Freq: Four times a day (QID) | ORAL | 0 refills | Status: AC | PRN
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
## 2024-05-02 NOTE — Progress Notes (Unsigned)
 Right hand 2nd metacarpal fracture DOI: 03/12/24

## 2024-05-06 ENCOUNTER — Other Ambulatory Visit (INDEPENDENT_AMBULATORY_CARE_PROVIDER_SITE_OTHER): Payer: Self-pay

## 2024-05-06 ENCOUNTER — Ambulatory Visit: Admitting: Orthopedic Surgery

## 2024-05-06 DIAGNOSIS — S52571A Other intraarticular fracture of lower end of right radius, initial encounter for closed fracture: Secondary | ICD-10-CM

## 2024-05-06 DIAGNOSIS — S62300A Unspecified fracture of second metacarpal bone, right hand, initial encounter for closed fracture: Secondary | ICD-10-CM

## 2024-06-03 ENCOUNTER — Other Ambulatory Visit: Payer: Self-pay

## 2024-06-03 ENCOUNTER — Ambulatory Visit: Payer: Self-pay | Attending: Orthopedic Surgery | Admitting: Occupational Therapy

## 2024-06-03 DIAGNOSIS — M6281 Muscle weakness (generalized): Secondary | ICD-10-CM | POA: Diagnosis present

## 2024-06-03 DIAGNOSIS — M25531 Pain in right wrist: Secondary | ICD-10-CM | POA: Diagnosis present

## 2024-06-03 DIAGNOSIS — M79644 Pain in right finger(s): Secondary | ICD-10-CM | POA: Insufficient documentation

## 2024-06-03 DIAGNOSIS — R278 Other lack of coordination: Secondary | ICD-10-CM | POA: Insufficient documentation

## 2024-06-03 DIAGNOSIS — S62300A Unspecified fracture of second metacarpal bone, right hand, initial encounter for closed fracture: Secondary | ICD-10-CM | POA: Insufficient documentation

## 2024-06-03 DIAGNOSIS — R29898 Other symptoms and signs involving the musculoskeletal system: Secondary | ICD-10-CM | POA: Diagnosis present
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
## 2024-06-03 NOTE — Patient Instructions (Signed)
 SABRA

## 2024-06-04 ENCOUNTER — Ambulatory Visit: Payer: Self-pay | Admitting: Occupational Therapy

## 2024-06-04 DIAGNOSIS — M79644 Pain in right finger(s): Secondary | ICD-10-CM

## 2024-06-04 DIAGNOSIS — M25531 Pain in right wrist: Secondary | ICD-10-CM

## 2024-06-04 DIAGNOSIS — M6281 Muscle weakness (generalized): Secondary | ICD-10-CM

## 2024-06-04 DIAGNOSIS — R278 Other lack of coordination: Secondary | ICD-10-CM

## 2024-06-04 DIAGNOSIS — R29898 Other symptoms and signs involving the musculoskeletal system: Secondary | ICD-10-CM

## 2024-06-04 NOTE — Patient Instructions (Signed)
" ° °  Access Code: [email or web address] URL: [email or web address] Date: 06/04/2024 Prepared by: Clarita Pride  Exercises - Putty Squeezes  - 1-2 x daily - 10 reps - Rolling Putty on Table  - 1-2 x daily - 10 reps - Finger Pinch and Pull with Putty  - 1-2 x daily - 10 reps - 3-Point Pinch with Putty  - 1-2 x daily - 10 reps - Tip PUSH with Putty  - 1-2 x daily - 10 reps - Key Pinch with Putty  - 1-2 x daily - 10 reps - Finger Extension with Putty  - 1-2 x daily - 10 reps - Finger Adduction with Putty  - 1-2 x daily - 10 reps - Removing Marbles from Putty  - 1-2 x daily - 10 reps   "
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
## 2024-06-13 ENCOUNTER — Ambulatory Visit: Payer: Self-pay | Admitting: Occupational Therapy

## 2024-06-13 DIAGNOSIS — M25531 Pain in right wrist: Secondary | ICD-10-CM

## 2024-06-13 DIAGNOSIS — M6281 Muscle weakness (generalized): Secondary | ICD-10-CM

## 2024-06-13 DIAGNOSIS — R278 Other lack of coordination: Secondary | ICD-10-CM

## 2024-06-13 DIAGNOSIS — M79644 Pain in right finger(s): Secondary | ICD-10-CM

## 2024-06-13 DIAGNOSIS — R29898 Other symptoms and signs involving the musculoskeletal system: Secondary | ICD-10-CM

## 2024-06-13 NOTE — Patient Instructions (Signed)
 Access Code: [email or web address] URL: [email or web address] Date: 06/13/2024 Prepared by: Clarita Pride  NEW Exercises - Wrist Flexion with Dumbbell  - 1 x daily - 10 reps - Seated Wrist Extension with Dumbbell  - 1 x daily - 10 reps - Seated Wrist Radial Deviation with Dumbbell  - 1 x daily - 10 reps - Seated Wrist Ulnar Deviation with Dumbbell  - 1 x daily - 10 reps - Seated Forearm Pronation and Supination with Hammer  - 1 x daily - 10 reps - Standing Wrist Radial Deviation with Hammer  - 1 x daily - 10 reps - Standing Wrist Ulnar Deviation with Hammer  - 1 x daily - 10 reps
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
## 2024-06-17 ENCOUNTER — Ambulatory Visit: Payer: Self-pay | Admitting: Occupational Therapy

## 2024-06-17 ENCOUNTER — Ambulatory Visit: Admitting: Orthopedic Surgery

## 2024-06-20 ENCOUNTER — Ambulatory Visit: Payer: Self-pay | Admitting: Occupational Therapy

## 2024-06-24 ENCOUNTER — Ambulatory Visit: Admitting: Orthopedic Surgery

## 2024-06-24 ENCOUNTER — Ambulatory Visit: Payer: Self-pay | Admitting: Occupational Therapy

## 2024-07-02 ENCOUNTER — Ambulatory Visit: Payer: Self-pay | Admitting: Occupational Therapy
